# Patient Record
Sex: Male | Born: 1980 | Race: White | Hispanic: No | Marital: Married | Smoking: Former smoker
Health system: Southern US, Community
[De-identification: ages and names within clinical notes are randomized; demographics above are authoritative.]

## PROBLEM LIST (undated history)

## (undated) DIAGNOSIS — I10 Essential (primary) hypertension: Secondary | ICD-10-CM

## (undated) DIAGNOSIS — K760 Fatty (change of) liver, not elsewhere classified: Secondary | ICD-10-CM

## (undated) DIAGNOSIS — R7989 Other specified abnormal findings of blood chemistry: Secondary | ICD-10-CM

## (undated) DIAGNOSIS — K529 Noninfective gastroenteritis and colitis, unspecified: Secondary | ICD-10-CM

## (undated) DIAGNOSIS — F419 Anxiety disorder, unspecified: Secondary | ICD-10-CM

## (undated) HISTORY — PX: COLONOSCOPY: SHX5424

## (undated) HISTORY — PX: LACERATION REPAIR: SHX5284

## (undated) HISTORY — PX: TONSILLECTOMY: SUR1361

## (undated) HISTORY — DX: Noninfective gastroenteritis and colitis, unspecified: K52.9

## (undated) HISTORY — DX: Anxiety disorder, unspecified: F41.9

---

## 2021-04-21 ENCOUNTER — Encounter: Payer: Self-pay | Admitting: Gastroenterology

## 2021-04-21 ENCOUNTER — Ambulatory Visit (INDEPENDENT_AMBULATORY_CARE_PROVIDER_SITE_OTHER): Payer: BC Managed Care – PPO | Admitting: Gastroenterology

## 2021-04-21 ENCOUNTER — Other Ambulatory Visit: Payer: Self-pay

## 2021-04-21 DIAGNOSIS — R748 Abnormal levels of other serum enzymes: Secondary | ICD-10-CM

## 2021-04-21 DIAGNOSIS — K529 Noninfective gastroenteritis and colitis, unspecified: Secondary | ICD-10-CM

## 2021-04-21 NOTE — Patient Instructions (Addendum)
Continue mesalamine as you are doing. At some point, we may need to bump this up. However, you are clinically doing well, so we will leave it be for now.  I am requesting labs from your PCP and the colonoscopy reports from both procedures and pathology.   We may need to do an ultrasound of the liver, but I will review labs first.  I recommend cutting back/avoiding alcohol.   Avoid Ibuprofen, Advil, Aleve, Motrin, etc., as this can flare up inflammatory bowel disease symptoms.  We will be in touch shortly!  It was a pleasure to see you today. I want to create trusting relationships with patients to provide genuine, compassionate, and quality care. I value your feedback. If you receive a survey regarding your visit,  I greatly appreciate you taking time to fill this out.   Gelene Mink, PhD, ANP-BC [facility]astroenter[facility]

## 2021-04-21 NOTE — Progress Notes (Addendum)
Primary Care Physician:  No primary care provider on file. Referring Physician: Dr. Laney Pastor Primary Gastroenterologist:  Dr. Abbey Chatters  Chief Complaint  Patient presents with   Ulcerative Colitis    [facility] told he had crohns after 1st TCS and placed on mesalamine. Then saw another doctor in [facility] after 2nd TCS and was told they never said he had crohns but had UC. Currently not having symptoms right now    HPI:   Kevin Wu is a 40 y.o. male presenting today at the request of Dr. Laney Pastor for evaluation of IBD. Patient previously seen by [facility] and undergoing colonoscopies X 2. He received conflicting reports regarding Crohn's disease and UC.   About 2-2.5 years ago was having blood in stool. Assumed was hemorrhoids. Went to [place]. Had colonoscopy and was told had Crohn's. Placed on mesalamine. Had repeat colonoscopy Feb 2022 for follow-up. Was told he had UC at that time. Additional symptoms included urgent soft stools but not diarrhea. No significant abdominal pain. Pays attention to foods that trigger symptoms like spicy foods. No weight loss associated. No N/V. Initial colonoscopy Dec 2020?  Symptoms all completely resolved. Has been on mesalamine immediately after first colonoscopy. No significant NSAIDs. Believes his father has UC.   Elevated transaminases also noted Dec 2021: AST 52, ALT 48, Alk Phos 80, Tbili 0.4. He drinks several beers daily. More on weekends with liquor shots.   Path report only dated Feb 2022: chronic active colitis at IC valve. Inflamed fragments of colonic mucosa with focal superficial hyperplastic change of splenic flexure. I do not have actual report or initial colonoscopy and path.     Past Medical History:  Diagnosis Date   Anxiety    IBD (inflammatory bowel disease)     History reviewed. No pertinent surgical history.  Current Outpatient Medications  Medication Sig Dispense Refill   escitalopram (LEXAPRO) 10 MG  tablet Take 10 mg by mouth daily.     mesalamine (LIALDA) 1.2 g EC tablet Take 1.2 g by mouth every morning.     No current facility-administered medications for this visit.    Allergies as of 04/21/2021   (No Known Allergies)    Family History  Problem Relation Age of Onset   Ulcerative colitis Father    Colon polyps Neg Hx    Colon cancer Neg Hx     Social History   Socioeconomic History   Marital status: Married    Spouse name: Not on file   Number of children: Not on file   Years of education: Not on file   Highest education level: Not on file  Occupational History   Occupation: Owns [employer]  Tobacco Use   Smoking status: Former    Types: Cigarettes   Smokeless tobacco: Never  Substance and Sexual Activity   Alcohol use: Yes    Comment: few beers a night   Drug use: Never   Sexual activity: Not on file  Other Topics Concern   Not on file  Social History Narrative   Not on file   Social Determinants of Health   Financial Resource Strain: Not on file  Food Insecurity: Not on file  Transportation Needs: Not on file  Physical Activity: Not on file  Stress: Not on file  Social Connections: Not on file  Intimate Partner Violence: Not on file    Review of Systems: Gen: Denies any fever, chills, fatigue, weight loss, lack of appetite.  CV: Denies chest  pain, heart palpitations, peripheral edema, syncope.  Resp: Denies shortness of breath at rest or with exertion. Denies wheezing or cough.  GI: see HPI GU : Denies urinary burning, urinary frequency, urinary hesitancy MS: Denies joint pain, muscle weakness, cramps, or limitation of movement.  Derm: Denies rash, itching, dry skin Psych: Denies depression, anxiety, memory loss, and confusion Heme: Denies bruising, bleeding, and enlarged lymph nodes.  Physical Exam: BP 130/87   Pulse 65   Temp 98 F (36.7 C)   Ht '6\' 2"'  (1.88 m)   Wt 176 lb 9.6 oz (80.1 kg)   BMI 22.67 kg/m  General:   Alert and  oriented. Pleasant and cooperative. Well-nourished and well-developed.  Head:  Normocephalic and atraumatic. Eyes:  Without icterus, sclera clear and conjunctiva pink.  Ears:  Normal auditory acuity. Mouth:  mask in place Lungs:  Clear to auscultation bilaterally. No wheezes, rales, or rhonchi. No distress.  Heart:  S1, S2 present without murmurs appreciated.  Abdomen:  +BS, soft, non-tender and non-distended. No HSM noted. No guarding or rebound. No masses appreciated.  Rectal:  Deferred  Msk:  Symmetrical without gross deformities. Normal posture. Extremities:  Without edema. Neurologic:  Alert and  oriented x4;  grossly normal neurologically. Skin:  Intact without significant lesions or rashes. Psych:  Alert and cooperative. Normal mood and affect.  ASSESSMENT: Kevin Wu is a 40 y.o. male presenting today with diagnosis of IBD in late 2020 by [facility] (Dr. Earley Brooke) and here for a second opinion. Unfortunately, I do not have colonoscopy reports from Dec 2020 or Feb 2022; I only have path from Feb 2022. He is on below maintenance dosage of Lialda at 1.2 grams daily, but he has had complete symptom resolution.  Also noted to have mildly elevated transaminases, which I suspect may be due to ETOH intake. No imaging on file. Will retrieve labs from PCP that were done recently.  As of note, father with history of UC. Discussed with patient we will need to obtain all reports before labeling type of IBD. As of now, he is clinically doing well. I am not changing mesalamine dosage just yet until I can review prior notes.    PLAN: Avoid NSAIDs Avoid/limit ETOH Obtain labs from PCP Obtain colonoscopy reports and path from [facility] Further recommendations to follow   Annitta Needs, PhD, ANP-BC [facility]enter[facility]   Addendum on 10/5: received colonoscopy report from Feb 2022 but not from first colonoscopy. Feb 2022 colonoscopy with non-specific superficial ulcers on the IC  valve, TI was normal. Deformity suggestive of previous polypectomy noted around splenic flexure.   Addendum on 10/19: Received colonoscopy report from Jan 2021: patchy discontinuous ulceration, friability, and erythema in cecum, hepatic flexure, transverse, and sigmoid. Pathology with chronic active colitis.  Dealing with Crohn's disease. Mesalamine would not be ideal treatment, but as he is already on this, will continue for now. May need to revisit changing this in the future. He is on low dosing at 1.2 grams daily. Will bump up to 2.4 grams daily.

## 2021-04-27 ENCOUNTER — Other Ambulatory Visit: Payer: Self-pay

## 2021-04-27 ENCOUNTER — Telehealth: Payer: Self-pay | Admitting: Gastroenterology

## 2021-04-27 ENCOUNTER — Encounter: Payer: Self-pay | Admitting: Gastroenterology

## 2021-04-27 DIAGNOSIS — K529 Noninfective gastroenteritis and colitis, unspecified: Secondary | ICD-10-CM

## 2021-04-27 DIAGNOSIS — R748 Abnormal levels of other serum enzymes: Secondary | ICD-10-CM

## 2021-04-27 NOTE — Progress Notes (Signed)
Phoned the pt and LMOVM for the pt to return call 

## 2021-04-27 NOTE — Progress Notes (Signed)
Pt returned call and was advised of the lab results and recommendations of a U/S of his liver, recommendations regarding alcohol and repeat bloodwork in 3 months. Waiting on colonoscopy report. Pt is  agreeable to these things.

## 2021-04-27 NOTE — Telephone Encounter (Signed)
[facility] sent [place] only Feb 2022 colonoscopy and path reports. We had asked for the ones from prior to that as well (2020/2021?). We need colonoscopy reports from then. Thanks!

## 2021-04-27 NOTE — Progress Notes (Signed)
Received outside labs dated July 2022:  Hgb 15.2, Hct 43.9, platelets 277.   BUN 16, Creatinine 0.89, Tbili 0.8, AST 72, ALT 44.   Please let patient know I would recommend an ultrasound of his liver. His enzymes are likely slightly elevated due to alcohol, as we talked about. I am holding off on extensive labs for now but recommend avoiding/limiting alcohol and rechecking HFP in 3 months.  I am still waiting on outside reports from first colonoscopy. Thanks!

## 2021-05-11 ENCOUNTER — Encounter: Payer: Self-pay | Admitting: Gastroenterology

## 2021-05-11 NOTE — Telephone Encounter (Signed)
Phoned the pt and LMOVM for the pt to return call 

## 2021-05-11 NOTE — Telephone Encounter (Signed)
Kevin Wu, please let patient know that we are likely dealing with Crohn's disease. I reviewed with Dr. Marletta Lor.   He is on Lialda 1.2 grams. Mesalamine is not typically given for Crohn's disease. However, as he is stable on this, we will continue for now. He is not on ideal dosing of mesalamine. Recommend increasing to 2 tablets daily. This would be a maintenance dose. We have room to bump it up to 4 tablets daily but let's start with 2.  Let me know if any questions!  We need to see him in about 6-8 months.

## 2021-05-12 ENCOUNTER — Encounter: Payer: Self-pay | Admitting: Internal Medicine

## 2021-05-12 ENCOUNTER — Other Ambulatory Visit: Payer: Self-pay

## 2021-05-12 DIAGNOSIS — R748 Abnormal levels of other serum enzymes: Secondary | ICD-10-CM

## 2021-05-12 NOTE — Progress Notes (Signed)
[place] abd complete scheduled for 05/17/21 at 10:30am, arrive at 10:15am. NPO after midnight prior to test.  Called and informed pt of [place] appt. Letter mailed.

## 2021-05-12 NOTE — Progress Notes (Signed)
Please arrange US abdomen complete due to elevated LFTs. Thanks!

## 2021-05-13 ENCOUNTER — Telehealth: Payer: Self-pay | Admitting: Gastroenterology

## 2021-05-13 NOTE — Telephone Encounter (Signed)
Refill request received for mesalamine 1.2 gm tab, to be sent to [facility] [place]. Last office visit was 04/21/2021 with Tobi Bastos.

## 2021-05-16 MED ORDER — MESALAMINE 1.2 G PO TBEC: 2.4000 g | DELAYED_RELEASE_TABLET | Freq: Every morning | ORAL | 5 refills | Status: DC

## 2021-05-16 NOTE — Addendum Note (Signed)
Addended by: Tiffany Kocher on: 05/16/2021 08:39 AM   Modules accepted: Orders

## 2021-05-16 NOTE — Telephone Encounter (Signed)
Phoned and LMOVM for the pt to return call 

## 2021-05-17 ENCOUNTER — Ambulatory Visit (HOSPITAL_COMMUNITY)
Admission: RE | Admit: 2021-05-17 | Discharge: 2021-05-17 | Disposition: A | Discharge status: Home / Self Care | Payer: BC Managed Care – PPO | Source: Ambulatory Visit | Attending: Gastroenterology | Admitting: Gastroenterology

## 2021-05-17 ENCOUNTER — Other Ambulatory Visit: Payer: Self-pay

## 2021-05-17 DIAGNOSIS — R748 Abnormal levels of other serum enzymes: Secondary | ICD-10-CM | POA: Diagnosis not present

## 2021-05-18 NOTE — Telephone Encounter (Signed)
Letter mailed out to the pt today. 

## 2021-05-20 NOTE — Telephone Encounter (Signed)
Phoned and spoke with the pt this morning, advised of the note and regarding his medication. He is not on Lialda 1.2 grams. He is taking 2 of the Mesalamine that was the instructions on the bottle when he picked up from the pharmacy.

## 2021-05-20 NOTE — Telephone Encounter (Signed)
Noted  

## 2021-05-20 NOTE — Telephone Encounter (Signed)
Thanks! Yes, we increased his mesalamine to 2 per day. That happened in the meantime.

## 2021-11-16 ENCOUNTER — Ambulatory Visit: Payer: BC Managed Care – PPO | Admitting: Gastroenterology

## 2022-02-14 ENCOUNTER — Ambulatory Visit: Payer: BC Managed Care – PPO | Admitting: Gastroenterology

## 2022-02-27 ENCOUNTER — Other Ambulatory Visit: Payer: Self-pay | Admitting: Gastroenterology

## 2022-02-27 NOTE — Telephone Encounter (Signed)
Sending in limited refill. He needs to keep his appointment with Tobi Bastos on 04/05/22.

## 2022-04-05 ENCOUNTER — Ambulatory Visit (INDEPENDENT_AMBULATORY_CARE_PROVIDER_SITE_OTHER): Payer: BC Managed Care – PPO | Admitting: Gastroenterology

## 2022-04-05 ENCOUNTER — Encounter: Payer: Self-pay | Admitting: Gastroenterology

## 2022-04-05 VITALS — BP 125/88 | HR 87 | Temp 97.9°F | Ht 74.0 in | Wt 164.6 lb

## 2022-04-05 DIAGNOSIS — R748 Abnormal levels of other serum enzymes: Secondary | ICD-10-CM

## 2022-04-05 DIAGNOSIS — K529 Noninfective gastroenteritis and colitis, unspecified: Secondary | ICD-10-CM | POA: Diagnosis not present

## 2022-04-05 MED ORDER — MESALAMINE 1.2 G PO TBEC: 2.4000 g | DELAYED_RELEASE_TABLET | Freq: Every morning | ORAL | 3 refills | Status: DC

## 2022-04-05 NOTE — Progress Notes (Signed)
Gastroenterology Office Note     Primary Care Physician:  Maximiano Coss, MD  Primary Gastroenterologist: Dr. Marletta Lor    Chief Complaint   Chief Complaint  Patient presents with   Inflammatory Bowel Disease    Follow up on IBD. Taking mesalamine. Doing well on med. No concerns.      History of Present Illness   Kevin Wu is a 41 y.o. male presenting today in follow-up with a history of Crohn's disease initially diagnosed in 2020 by Dr. Samuella Cota, now established with [place]. He was placed on mesalamine by prior GI. History of mildly elevated transaminases as well. [place] with fatty liver. Last colonoscopy Feb 2022 with non-specific superficial ulcers on IC valve, TI normal.    He is taking 2.4 grams of mesalamine daily. No abdominal pain, N/V, changes in bowel habits, constipation, diarrhea, overt GI bleeding, GERD, dysphagia, unexplained weight loss, lack of appetite, unexplained weight gain.    Received outside labs dated July 2022:   Hgb 15.2, Hct 43.9, platelets 277.    BUN 16, Creatinine 0.89, Tbili 0.8, AST 72, ALT 44.   Drinks several beers and several shots a day.    Past Medical History:  Diagnosis Date   Anxiety    IBD (inflammatory bowel disease)     No past surgical history on file.  Current Outpatient Medications  Medication Sig Dispense Refill   escitalopram (LEXAPRO) 10 MG tablet Take 10 mg by mouth daily.     mesalamine (LIALDA) 1.2 g EC tablet Take 2 tablets (2.4 g total) by mouth every morning. 180 tablet 3   No current facility-administered medications for this visit.    Allergies as of 04/05/2022   (No Known Allergies)    Family History  Problem Relation Age of Onset   Ulcerative colitis Father    Colon polyps Neg Hx    Colon cancer Neg Hx     Social History   Socioeconomic History   Marital status: Married    Spouse name: Not on file   Number of children: Not on file   Years of education: Not on file   Highest  education level: Not on file  Occupational History   Occupation: Owns Teacher, early years/pre  Tobacco Use   Smoking status: Former    Types: Cigarettes    Passive exposure: Current   Smokeless tobacco: Never  Substance and Sexual Activity   Alcohol use: Yes    Comment: few beers a night   Drug use: Never   Sexual activity: Not on file  Other Topics Concern   Not on file  Social History Narrative   Not on file   Social Determinants of Health   Financial Resource Strain: Not on file  Food Insecurity: Not on file  Transportation Needs: Not on file  Physical Activity: Not on file  Stress: Not on file  Social Connections: Not on file  Intimate Partner Violence: Not on file     Review of Systems   Gen: Denies any fever, chills, fatigue, weight loss, lack of appetite.  CV: Denies chest pain, heart palpitations, peripheral edema, syncope.  Resp: Denies shortness of breath at rest or with exertion. Denies wheezing or cough.  GI: Denies dysphagia or odynophagia. Denies jaundice, hematemesis, fecal incontinence. GU : Denies urinary burning, urinary frequency, urinary hesitancy MS: Denies joint pain, muscle weakness, cramps, or limitation of movement.  Derm: Denies rash, itching, dry skin Psych: Denies depression, anxiety, memory loss, and confusion Heme: Denies  bruising, bleeding, and enlarged lymph nodes.   Physical Exam   BP 125/88 (BP Location: Left Arm, Patient Position: Sitting, Cuff Size: Large)   Pulse 87   Temp 97.9 F (36.6 C) (Oral)   Ht 6\' 2"  (1.88 m)   Wt 164 lb 9.6 oz (74.7 kg)   BMI 21.13 kg/m  General:   Alert and oriented. Pleasant and cooperative. Well-nourished and well-developed.  Head:  Normocephalic and atraumatic. Eyes:  Without icterus Abdomen:  +BS, soft, non-tender and non-distended. No HSM noted. No guarding or rebound. No masses appreciated.  Rectal:  Deferred  Msk:  Symmetrical without gross deformities. Normal posture. Extremities:  Without  edema. Neurologic:  Alert and  oriented x4;  grossly normal neurologically. Skin:  Intact without significant lesions or rashes. Psych:  Alert and cooperative. Normal mood and affect.   Assessment   Kevin Wu is a 41 y.o. male presenting today in follow-up with a history of Crohn's disease initially diagnosed in 2020 by Dr. 2021, now established with Samuella Cota. He was placed on mesalamine by prior GI. History of mildly elevated transaminases as well. [place] with fatty liver. Last colonoscopy Feb 2022 with non-specific superficial ulcers on IC valve, TI normal.    He is actually clinically doing well on 2.4 grams of mesalamine daily. No concerns today.   Elevated transaminases in setting of fatty liver and alcohol intake. Discussed serial monitoring of labs, alcohol cessation. Will hold off on extensive serologies unless LFTs remain elevated.      PLAN    Continue mesalamine CBC, CMP Return in 1 year   Mar 2022, PhD, [facility] Gastroenterology

## 2022-04-05 NOTE — Patient Instructions (Signed)
Please have blood work done.  We will see you in 1 year or sooner if needed.  Please call with any changes or concerns!  As we discussed, it would be best to avoid alcohol due to known fatty liver.   I enjoyed seeing you again today! As you know, I value our relationship and want to provide genuine, compassionate, and quality care. I welcome your feedback. If you receive a survey regarding your visit,  I greatly appreciate you taking time to fill this out. See you next time!  Gelene Mink, PhD, ANP-BC [facility] Gastroenterology

## 2022-05-04 LAB — CBC WITH DIFFERENTIAL/PLATELET
Absolute Monocytes: 416 cells/uL (ref 200–950)
Basophils Absolute: 30 cells/uL (ref 0–200)
Basophils Relative: 0.9 %
Eosinophils Absolute: 142 cells/uL (ref 15–500)
Eosinophils Relative: 4.3 %
HCT: 43.4 % (ref 38.5–50.0)
Hemoglobin: 15.2 g/dL (ref 13.2–17.1)
Lymphs Abs: 944 cells/uL (ref 850–3900)
MCH: 33.3 pg — ABNORMAL HIGH (ref 27.0–33.0)
MCHC: 35 g/dL (ref 32.0–36.0)
MCV: 95.2 fL (ref 80.0–100.0)
MPV: 9.3 fL (ref 7.5–12.5)
Monocytes Relative: 12.6 %
Neutro Abs: 1769 cells/uL (ref 1500–7800)
Neutrophils Relative %: 53.6 %
Platelets: 271 10*3/uL (ref 140–400)
RBC: 4.56 10*6/uL (ref 4.20–5.80)
RDW: 11.7 % (ref 11.0–15.0)
Total Lymphocyte: 28.6 %
WBC: 3.3 10*3/uL — ABNORMAL LOW (ref 3.8–10.8)

## 2022-05-04 LAB — COMPLETE METABOLIC PANEL WITH GFR
AG Ratio: 1.6 (calc) (ref 1.0–2.5)
ALT: 18 U/L (ref 9–46)
AST: 27 U/L (ref 10–40)
Albumin: 4.7 g/dL (ref 3.6–5.1)
Alkaline phosphatase (APISO): 42 U/L (ref 36–130)
BUN: 11 mg/dL (ref 7–25)
CO2: 25 mmol/L (ref 20–32)
Calcium: 9.5 mg/dL (ref 8.6–10.3)
Chloride: 106 mmol/L (ref 98–110)
Creat: 0.78 mg/dL (ref 0.60–1.29)
Globulin: 3 g/dL (calc) (ref 1.9–3.7)
Glucose, Bld: 85 mg/dL (ref 65–99)
Potassium: 4.3 mmol/L (ref 3.5–5.3)
Sodium: 142 mmol/L (ref 135–146)
Total Bilirubin: 0.4 mg/dL (ref 0.2–1.2)
Total Protein: 7.7 g/dL (ref 6.1–8.1)
eGFR: 116 mL/min/{1.73_m2} (ref >=60)

## 2022-05-11 ENCOUNTER — Other Ambulatory Visit: Payer: Self-pay

## 2022-05-11 DIAGNOSIS — R748 Abnormal levels of other serum enzymes: Secondary | ICD-10-CM

## 2022-05-11 DIAGNOSIS — K529 Noninfective gastroenteritis and colitis, unspecified: Secondary | ICD-10-CM

## 2022-05-15 ENCOUNTER — Other Ambulatory Visit: Payer: Self-pay

## 2022-05-15 DIAGNOSIS — K529 Noninfective gastroenteritis and colitis, unspecified: Secondary | ICD-10-CM

## 2022-05-15 DIAGNOSIS — R748 Abnormal levels of other serum enzymes: Secondary | ICD-10-CM

## 2022-05-15 NOTE — Progress Notes (Signed)
Error

## 2022-10-12 IMAGING — US US ABDOMEN COMPLETE
1 series · 14 of 25 positions shown · non-contrast
Comparison: None.

CLINICAL DATA: Elevated LFT

EXAM:
ABDOMEN ULTRASOUND COMPLETE

[Series 1: us abdomen complete · 0.19mm/px · 14 of 132 slices shown]
[im 1/132]
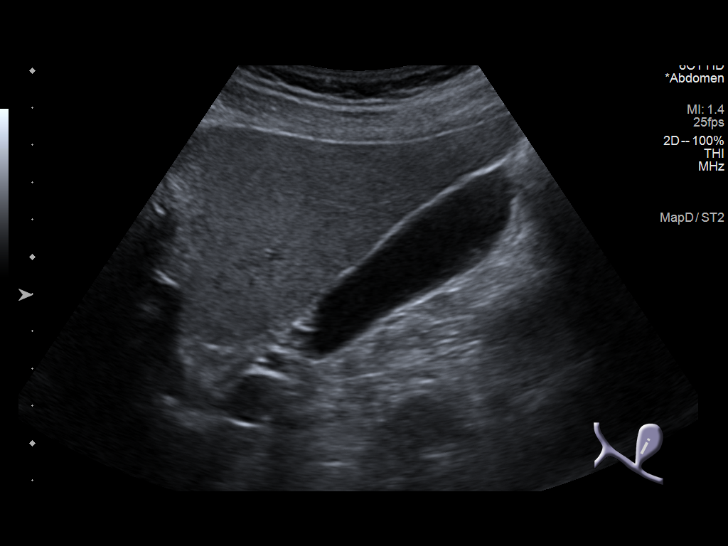
[im 11/132]
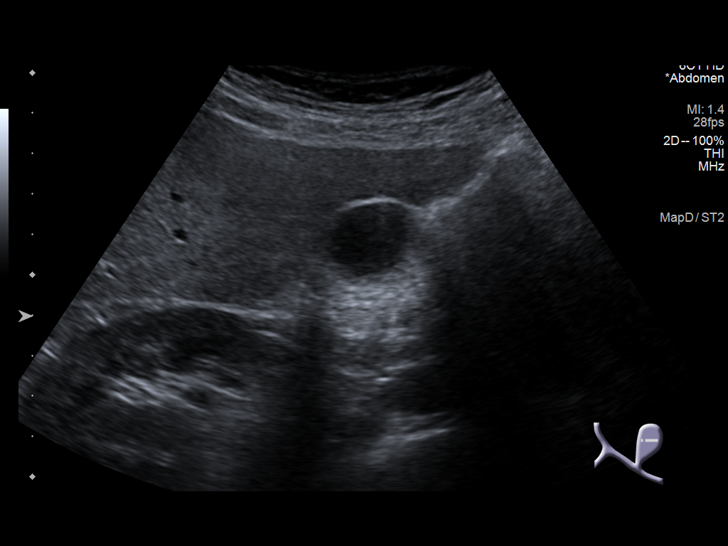
[im 22/132]
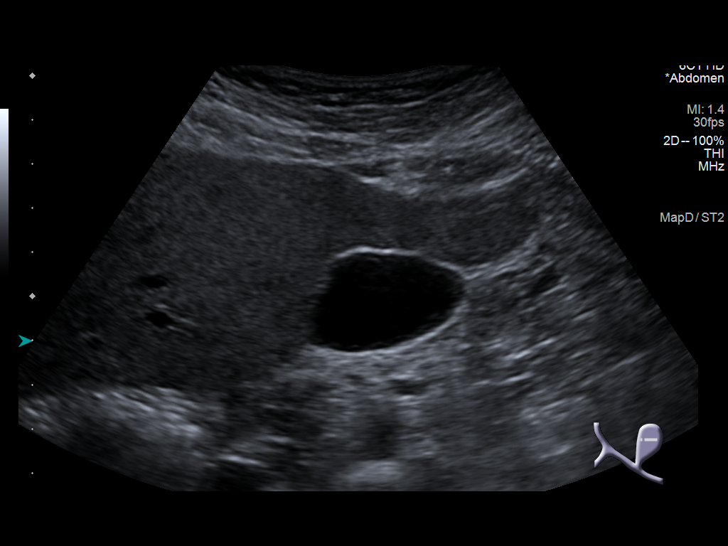
[im 33/132]
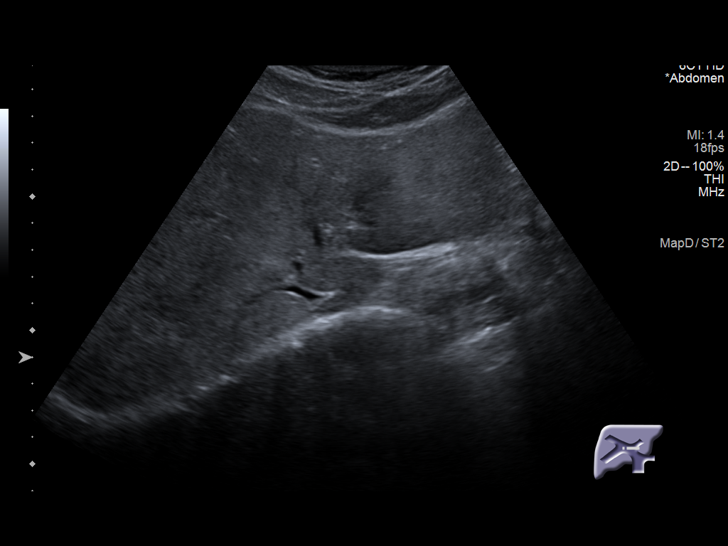
[im 44/132]
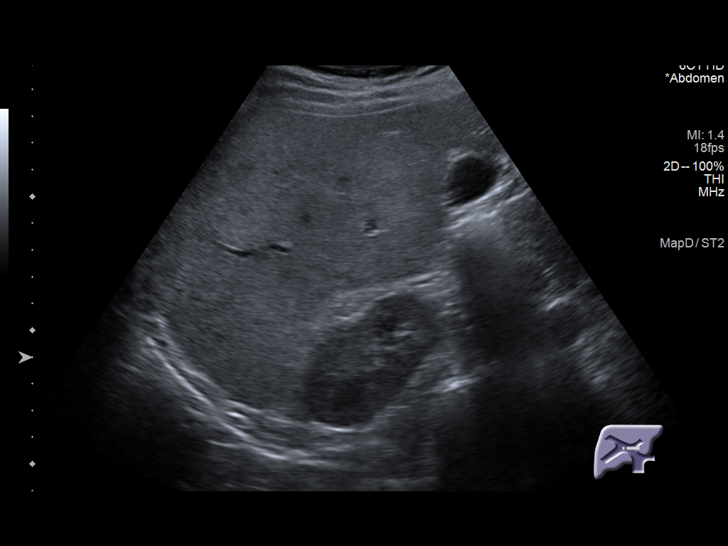
[im 50/132]
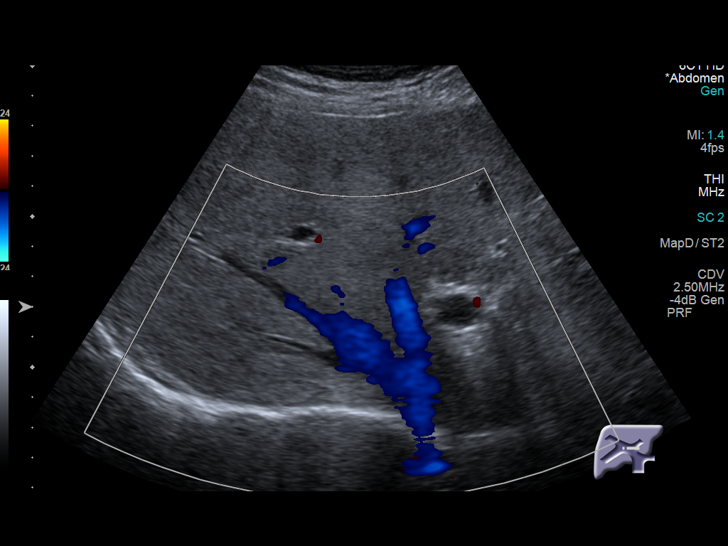
[im 61/132]
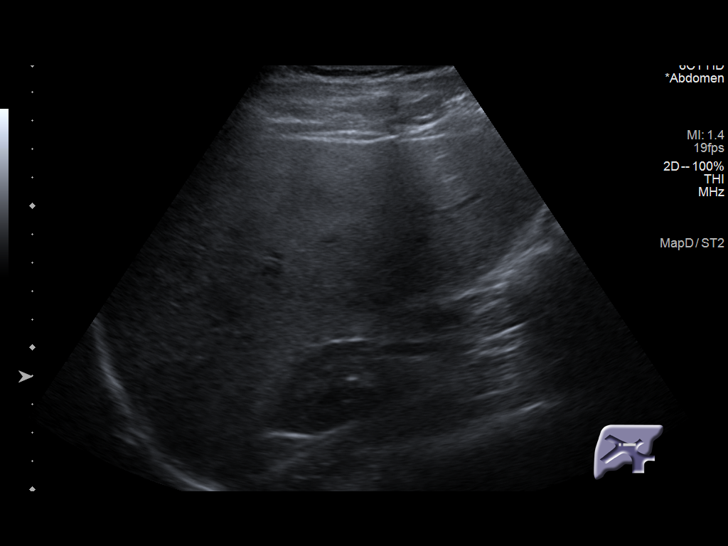
[im 71/132]
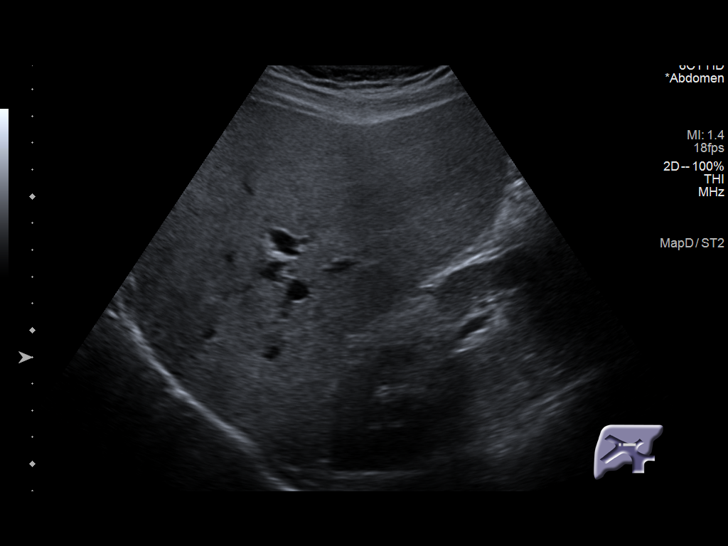
[im 82/132]
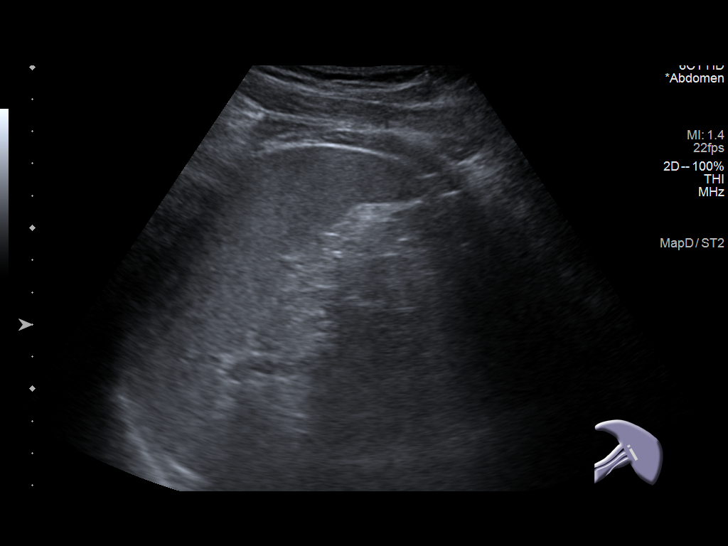
[im 88/132]
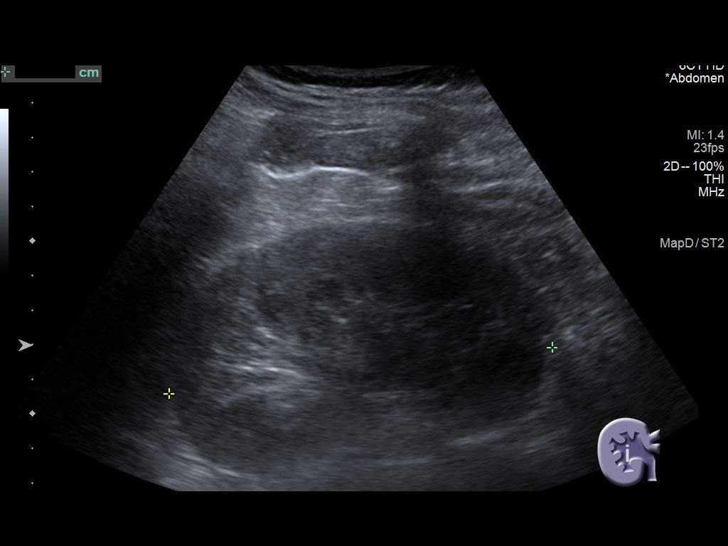
[im 99/132]
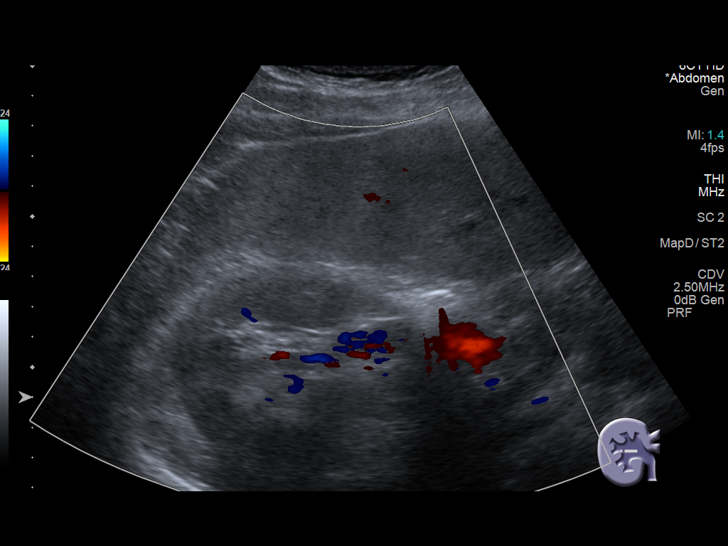
[im 110/132]
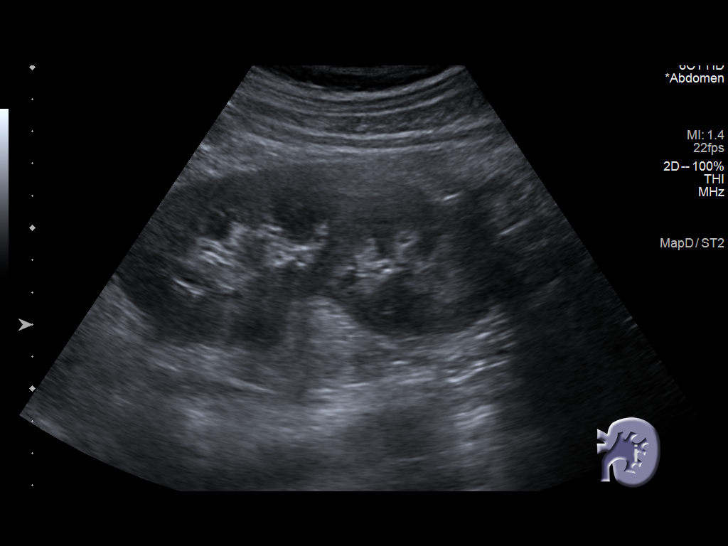
[im 121/132]
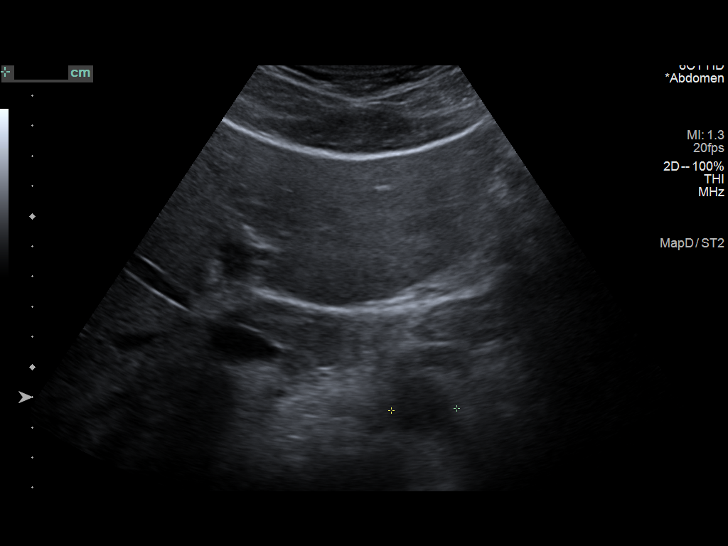
[im 132/132]
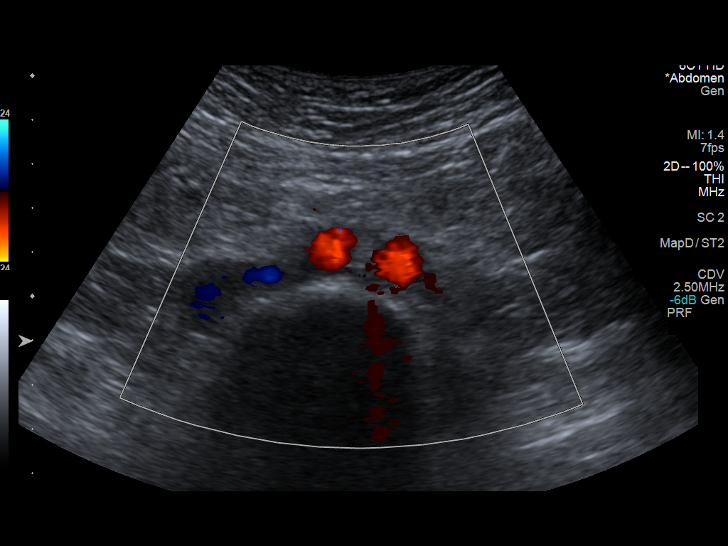

[14 of 25 positions shown; findings below may reference images not displayed]

FINDINGS: Gallbladder: No gallstones or wall thickening visualized. No
sonographic Murphy sign noted by sonographer.

Common bile duct: Diameter: 4.8 mm

Liver: Slightly echogenic. No focal hepatic abnormality. Portal vein
is patent on color Doppler imaging with normal direction of blood
flow towards the liver.

IVC: No abnormality visualized.

Pancreas: Visualized portion unremarkable.

Spleen: Size and appearance within normal limits.

Right Kidney: Length: 11.2 cm. Echogenicity within normal limits. No
mass or hydronephrosis visualized.

Left Kidney: Length: 11.1 cm. Echogenicity within normal limits. No
mass or hydronephrosis visualized.

Abdominal aorta: No aneurysm visualized.

Other findings: None.
IMPRESSION: Echogenic liver consistent with hepatic steatosis. Otherwise
negative abdominal ultrasound

## 2023-03-08 ENCOUNTER — Encounter: Payer: Self-pay | Admitting: Gastroenterology

## 2023-05-08 ENCOUNTER — Other Ambulatory Visit: Payer: Self-pay | Admitting: Gastroenterology

## 2023-08-28 NOTE — Progress Notes (Signed)
Called patient and left a message asking him to call and schedule a follow up appt

## 2023-08-30 ENCOUNTER — Other Ambulatory Visit: Payer: Self-pay | Admitting: Gastroenterology

## 2023-08-31 LAB — HEPATIC FUNCTION PANEL
AG Ratio: 1.9 (calc) (ref 1.0–2.5)
ALT: 35 U/L (ref 9–46)
AST: 39 U/L (ref 10–40)
Albumin: 4.7 g/dL (ref 3.6–5.1)
Alkaline phosphatase (APISO): 43 U/L (ref 36–130)
Bilirubin, Direct: 0.1 mg/dL (ref 0.0–0.2)
Globulin: 2.5 g/dL (ref 1.9–3.7)
Indirect Bilirubin: 0.6 mg/dL (ref 0.2–1.2)
Total Bilirubin: 0.7 mg/dL (ref 0.2–1.2)
Total Protein: 7.2 g/dL (ref 6.1–8.1)

## 2023-09-27 ENCOUNTER — Encounter: Payer: Self-pay | Admitting: Gastroenterology

## 2023-09-27 ENCOUNTER — Other Ambulatory Visit: Payer: Self-pay | Admitting: *Deleted

## 2023-09-27 ENCOUNTER — Ambulatory Visit (INDEPENDENT_AMBULATORY_CARE_PROVIDER_SITE_OTHER): Payer: BC Managed Care – PPO | Admitting: Gastroenterology

## 2023-09-27 VITALS — BP 130/92 | HR 76 | Temp 98.1°F | Ht 74.0 in | Wt 191.0 lb

## 2023-09-27 DIAGNOSIS — K4090 Unilateral inguinal hernia, without obstruction or gangrene, not specified as recurrent: Secondary | ICD-10-CM

## 2023-09-27 DIAGNOSIS — K529 Noninfective gastroenteritis and colitis, unspecified: Secondary | ICD-10-CM

## 2023-09-27 NOTE — Progress Notes (Signed)
 Gastroenterology Office Note     Primary Care Physician:  Kevin Coss, MD  Primary Gastroenterologist: Dr. Marletta Wu    Chief Complaint   Chief Complaint  Patient presents with   Inflammatory Bowel Disease    Follow up on IBD. On mesalamine. States doing well.no concerns      History of Present Illness   Kevin Wu is a 43 y.o. 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 Last colonoscopy Feb 2022 as noted below. Additional pertinent GI history including hepatic steatosis, elevated transaminases in setting of ETOH use.   Returning today for refills and follow-up of IBD. No overt GI bleeding. No abdominal pain. He does have urgency at times but reports getting used to this over time. No significant concerns today. Needs updated labs, inflammatory markers, fecal calprotectin. Recommending colonoscopy in near future with TI biopsies.       Colonoscopy Jan 2021: patchy discontinuous ulceration, friability and erythema in cecum, hepatic flexure, transverse, and sigmoid colon compatible with Crohn's disease. Path with chronic active colitis of cecum, transverse colon, and sigmoid   Colonoscopy Feb 2022: non-specific superficial ulcers on IC valve, TI normal, path with chronic active colitis with evidence of ulceration, inflamed fragments of splenic flexure.   Past Medical History:  Diagnosis Date   Anxiety    IBD (inflammatory bowel disease)     No past surgical history on file.  Current Outpatient Medications  Medication Sig Dispense Refill   buPROPion (WELLBUTRIN SR) 150 MG 12 hr tablet Take 150 mg by mouth 2 (two) times daily.     escitalopram (LEXAPRO) 10 MG tablet Take 10 mg by mouth daily.     mesalamine (LIALDA) 1.2 g EC tablet TAKE 2 TABLETS BY  MOUTH EVERY MORNING 180 tablet 0   Multiple Vitamin (MULTIVITAMIN) tablet Take 1 tablet by mouth daily.     No current facility-administered medications for this visit.    Allergies as of 09/27/2023   (No Known Allergies)    Family History  Problem Relation Age of Onset   Ulcerative colitis Father        patient not sure   Colon polyps Neg Hx    Colon cancer Neg Hx     Social History   Socioeconomic History   Marital status: Married    Spouse name: Not on file   Number of children: Not on file   Years of education: Not on file   Highest education level: Not on file  Occupational History   Occupation: Owns Teacher, early years/pre  Tobacco Use   Smoking status: Former    Types: Cigarettes    Passive exposure: Current   Smokeless tobacco: Never  Substance and Sexual Activity   Alcohol use: Yes    Comment: few beers a night   Drug use: Never   Sexual activity: Not on file  Other Topics Concern   Not on file  Social History Narrative   Not on file   Social Drivers of Health   Financial Resource Strain: Not on file  Food Insecurity: Not on file  Transportation Needs: Not on file  Physical Activity: Not on file  Stress: Not on file  Social Connections: Not on file  Intimate Partner Violence: Not on file     Review of Systems  Gen: Denies any fever, chills, fatigue, weight loss, lack of appetite.  CV: Denies chest pain, heart palpitations, peripheral edema, syncope.  Resp: Denies shortness of breath at rest or with exertion. Denies wheezing or cough.  GI: Denies dysphagia or odynophagia. Denies jaundice, hematemesis, fecal incontinence. GU : Denies urinary burning, urinary frequency, urinary hesitancy MS: Denies joint pain, muscle weakness, cramps, or limitation of movement.  Derm: Denies rash, itching, dry skin Psych: Denies depression, anxiety, memory loss, and confusion Heme: Denies bruising, bleeding, and enlarged lymph nodes.   Physical Exam   BP (!) 130/92    Pulse 76   Temp 98.1 F (36.7 C)   Ht 6\' 2"  (1.88 m)   Wt 191 lb (86.6 kg)   BMI 24.52 kg/m  General:   Alert and oriented. Pleasant and cooperative. Well-nourished and well-developed.  Head:  Normocephalic and atraumatic. Eyes:  Without icterus Abdomen:  +BS, soft, non-tender and non-distended. No HSM noted. No guarding or rebound. No masses appreciated.  Rectal:  Deferred  Msk:  Symmetrical without gross deformities. Normal posture. Extremities:  Without edema. Neurologic:  Alert and  oriented x4;  grossly normal neurologically. Skin:  Intact without significant lesions or rashes. Psych:  Alert and cooperative. Normal mood and affect.   Assessment   Kevin Wu is a 43 y.o. male presenting today with a history ofsuspected ileocolonic Crohn's disease diagnosed in 2021 at outside facility and placed on mesalamine after first colonoscopy Jan 2021; he notes there had been some question between UC and Crohn's at time of colonoscopy. Last colonoscopy Feb 2022 as noted above.  Crohn's: he is not on ideal therapy for Crohn's disease, as he is on a mesalamine. Upon further discussion, he does note urgency, and I also note last colonoscopy Feb 2022 with chronic active colitis. I query if he is in true remission at this time. Will order inflammatory markers, fecal cal, and recommend ileocolonoscopy. He may ultimately need different therapy and suspect his QOL could be improved regarding fecal urgency; interestingly, he has gotten used to this as a baseline, and I suspect could be improved.     PLAN    Labs including inflammatory markers, fecal cal Proceed with colonoscopy by Dr. Marletta Wu  in near future: the risks, benefits, and alternatives have been discussed with the patient in detail. The patient states understanding and desires to proceed.  Recommend significantly cutting back/avoiding on ETOH in setting of known hepatic steatosis   Kevin Mink, PhD, ANP-BC [facility]  Gastroenterology

## 2023-09-27 NOTE — Patient Instructions (Signed)
 I have ordered routine labs to have done at [facility]. This checks inflammatory markers so we can see how well things are controlled. I also ordered a separate stool test, which is more sensitive for inflammation and a good baseline we can have on file.  For now, continue mesalamine. We are arranging a colonoscopy in the near future! We can then see if you are in remission or if you need more escalated therapy. The good news is there are lots of options out there that are easy to take and will fit with your lifestyle and improve quality of life.  Return in 3 months after colonoscopy!  I enjoyed seeing you again today! I value our relationship and want to provide genuine, compassionate, and quality care. You may receive a survey regarding your visit with me, and I welcome your feedback! Thanks so much for taking the time to complete this. I look forward to seeing you again.      Gelene Mink, PhD, ANP-BC [facility]

## 2023-09-28 ENCOUNTER — Telehealth: Payer: Self-pay | Admitting: *Deleted

## 2023-09-28 ENCOUNTER — Encounter: Payer: Self-pay | Admitting: *Deleted

## 2023-09-28 ENCOUNTER — Other Ambulatory Visit: Payer: Self-pay | Admitting: *Deleted

## 2023-09-28 MED ORDER — PEG 3350-KCL-NA BICARB-NACL 420 G PO SOLR: 4000.0000 mL | Freq: Once | ORAL | 0 refills | Status: DC

## 2023-09-28 NOTE — Telephone Encounter (Signed)
 [employer] PA: Notification or Prior Authorization is not required for the requested services You are not required to submit a notification/prior authorization based on the information provided. The number above acknowledges your inquiry and our response. Please reference this number for future inquiries. Notification is not a guarantee of coverage or payment. Questions should be directed to [email or web address] > Eligibility or [phone]. Decision ID #: [id number]

## 2023-09-28 NOTE — Telephone Encounter (Signed)
 Pt called back to reschedule his procedure that was scheduled for 11/06/23. He said he would prefer to have mid May. Advised pt that we don't have providers schedule for May  but once we get it, we will give him a call be to rescheduled.

## 2023-10-26 ENCOUNTER — Ambulatory Visit: Payer: Self-pay | Admitting: General Surgery

## 2023-10-26 NOTE — Progress Notes (Signed)
 Surgery orders requested via Epic inbox.

## 2023-10-31 NOTE — Progress Notes (Signed)
 COVID Vaccine received:  []  No [x]  Yes Date of any COVID positive Test in last 90 days:  PCP - Donnetta Hutching, MD at [facility]  [place] [phone] Cardiologist - none  Chest x-ray -  EKG -  Will do at PST Stress Test -  ECHO -  Cardiac Cath -   Bowel Prep - [x]  No  []   Yes ______  Pacemaker / ICD device [x]  No []  Yes   Spinal Cord Stimulator:[x]  No []  Yes       History of Sleep Apnea? [x]  No []  Yes   CPAP used?- [x]  No []  Yes    Does the patient monitor blood sugar?   [x]  N/A   []  No []  Yes  Patient has: [x]  NO Hx DM   []  Pre-DM   []  DM1  []   DM2 Last A1c was:  5.2   on  02-08-2021     Blood Thinner / Instructions:  none Aspirin Instructions:  none  ERAS Protocol Ordered: []  No  [x]  Yes PRE-SURGERY []  ENSURE  []  G2   [x]  No Drink Ordered Patient is to be NPO after: 0730  Dental hx: []  Dentures:  []  N/A      []  Bridge or Partial:                   []  Loose or Damaged teeth:   Comments:   Activity level: Patient is able / unable to climb a flight of stairs without difficulty; []  No CP  []  No SOB, but would have ___   Patient can / can not perform ADLs without assistance.   Anesthesia review: HTN- no meds, IBS, Fatty liver, ?LFTS, smokes, ETOH abuse, anxiety  Patient denies shortness of breath, fever, cough and chest pain at PAT appointment.  Patient verbalized understanding and agreement to the Pre-Surgical Instructions that were given to them at this PAT appointment. Patient was also educated of the need to review these PAT instructions again prior to his surgery.I reviewed the appropriate phone numbers to call if they have any and questions or concerns.

## 2023-10-31 NOTE — Patient Instructions (Signed)
 SURGICAL WAITING ROOM VISITATION Patients having surgery or a procedure may have no more than 2 support people in the waiting area - these visitors may rotate in the visitor waiting room.   If the patient needs to stay at the hospital during part of their recovery, the visitor guidelines for inpatient rooms apply.  PRE-OP VISITATION  Pre-op nurse will coordinate an appropriate time for 1 support person to accompany the patient in pre-op.  This support person may not rotate.  This visitor will be contacted when the time is appropriate for the visitor to come back in the pre-op area.  Please refer to the [facility] website for the visitor guidelines for Inpatients (after your surgery is over and you are in a regular room).  You are not required to quarantine at this time prior to your surgery. However, you must do this: Hand Hygiene often Do NOT share personal items Notify your provider if you are in close contact with someone who has COVID or you develop fever 100.4 or greater, new onset of sneezing, cough, sore throat, shortness of breath or body aches.  If you test positive for Covid or have been in contact with anyone that has tested positive in the last 10 days please notify you surgeon.    Your procedure is scheduled on:  MONDAY  November 05, 2023  Report to [facility] Main Entrance: [address] entrance where the [employer] is available.   Report to admitting at:  08:15   AM  Call this number if you have any questions or problems the morning of surgery [phone]  FOLLOW ANY ADDITIONAL PRE OP INSTRUCTIONS YOU RECEIVED FROM YOUR SURGEON'S OFFICE!!!  Do not eat food after Midnight the night prior to your surgery/procedure.  After Midnight you may have the following liquids until  07:30  AM  DAY OF SURGERY  Clear Liquid Diet Water Black Coffee (sugar ok, NO MILK/CREAM OR CREAMERS)  Tea (sugar ok, NO MILK/CREAM OR CREAMERS) regular and decaf                              Plain Jell-O  with no fruit (NO RED)                                           Fruit ices (not with fruit pulp, NO RED)                                     Popsicles (NO RED)                                                                  Juice: NO CITRUS JUICES: only apple, WHITE grape, WHITE cranberry Sports drinks like Gatorade or Powerade (NO RED)               Oral Hygiene is also important to reduce your risk of infection.        Remember - BRUSH YOUR TEETH THE MORNING OF SURGERY WITH YOUR REGULAR TOOTHPASTE  Do  NOT smoke after Midnight the night before surgery.  STOP TAKING all Vitamins, Herbs and supplements 1 week before your surgery.   Take ONLY these medicines the morning of surgery with A SIP OF WATER: escitalopram, bupropion.                 You may not have any metal on your body including  jewelry, and body piercing  Do not wear lotions, powders, cologne, or deodorant  Men may shave face and neck.  Contacts, Hearing Aids, dentures or bridgework may not be worn into surgery. DENTURES WILL BE REMOVED PRIOR TO SURGERY PLEASE DO NOT APPLY "Poly grip" OR ADHESIVES!!!  You may bring a small overnight bag with you on the day of surgery, only pack items that are not valuable. [facility] IS NOT RESPONSIBLE   FOR VALUABLES THAT ARE LOST OR STOLEN.   Patients discharged on the day of surgery will not be allowed to drive home.  Someone NEEDS to stay with you for the first 24 hours after anesthesia.  Do not bring your home medications to the hospital. The Pharmacy will dispense medications listed on your medication list to you during your admission in the Hospital.  Please read over the following fact sheets you were given: IF YOU HAVE QUESTIONS ABOUT YOUR PRE-OP INSTRUCTIONS, PLEASE CALL [phone]   [facility] - Preparing for Surgery Before surgery, you can play an important role.  Because skin is not sterile, your skin needs to be as free of germs as possible.  You can  reduce the number of germs on your skin by washing with CHG (chlorahexidine gluconate) soap before surgery.  CHG is an antiseptic cleaner which kills germs and bonds with the skin to continue killing germs even after washing. Please DO NOT use if you have an allergy to CHG or antibacterial soaps.  If your skin becomes reddened/irritated stop using the CHG and inform your nurse when you arrive at Short Stay. Do not shave (including legs and underarms) for at least 48 hours prior to the first CHG shower.  You may shave your face/neck.  Please follow these instructions carefully:  1.  Shower with CHG Soap the night before surgery and the  morning of surgery.  2.  If you choose to wash your hair, wash your hair first as usual with your normal  shampoo.  3.  After you shampoo, rinse your hair and body thoroughly to remove the shampoo.                             4.  Use CHG as you would any other liquid soap.  You can apply chg directly to the skin and wash.  Gently with a scrungie or clean washcloth.  5.  Apply the CHG Soap to your body ONLY FROM THE NECK DOWN.   Do not use on face/ open                           Wound or open sores. Avoid contact with eyes, ears mouth and genitals (private parts).                       Wash face,  Genitals (private parts) with your normal soap.             6.  Wash thoroughly, paying special attention to the area where your  surgery  will be performed.  7.  Thoroughly rinse your body with warm water from the neck down.  8.  DO NOT shower/wash with your normal soap after using and rinsing off the CHG Soap.            9.  Pat yourself dry with a clean towel.            10.  Wear clean pajamas.            11.  Place clean sheets on your bed the night of your first shower and do not  sleep with pets.  ON THE DAY OF SURGERY : Do not apply any lotions/deodorants the morning of surgery.  Please wear clean clothes to the hospital/surgery center.     FAILURE TO FOLLOW  THESE INSTRUCTIONS MAY RESULT IN THE CANCELLATION OF YOUR SURGERY  PATIENT SIGNATURE_________________________________  NURSE SIGNATURE__________________________________  ________________________________________________________________________

## 2023-11-01 ENCOUNTER — Encounter (HOSPITAL_COMMUNITY): Payer: Self-pay

## 2023-11-01 ENCOUNTER — Encounter (HOSPITAL_COMMUNITY)
Admission: RE | Admit: 2023-11-01 | Discharge: 2023-11-01 | Disposition: A | Discharge status: Home / Self Care | Source: Ambulatory Visit | Attending: General Surgery | Admitting: General Surgery

## 2023-11-01 ENCOUNTER — Other Ambulatory Visit: Payer: Self-pay

## 2023-11-01 VITALS — BP 148/108 | HR 68 | Temp 98.7°F | Resp 14 | Ht 74.0 in | Wt 188.0 lb

## 2023-11-01 DIAGNOSIS — K760 Fatty (change of) liver, not elsewhere classified: Secondary | ICD-10-CM | POA: Diagnosis not present

## 2023-11-01 DIAGNOSIS — Z01818 Encounter for other preprocedural examination: Secondary | ICD-10-CM | POA: Diagnosis present

## 2023-11-01 DIAGNOSIS — R7989 Other specified abnormal findings of blood chemistry: Secondary | ICD-10-CM | POA: Insufficient documentation

## 2023-11-01 DIAGNOSIS — F17290 Nicotine dependence, other tobacco product, uncomplicated: Secondary | ICD-10-CM | POA: Insufficient documentation

## 2023-11-01 DIAGNOSIS — I10 Essential (primary) hypertension: Secondary | ICD-10-CM | POA: Diagnosis not present

## 2023-11-01 DIAGNOSIS — F419 Anxiety disorder, unspecified: Secondary | ICD-10-CM | POA: Diagnosis not present

## 2023-11-01 DIAGNOSIS — F1090 Alcohol use, unspecified, uncomplicated: Secondary | ICD-10-CM | POA: Insufficient documentation

## 2023-11-01 HISTORY — DX: Essential (primary) hypertension: I10

## 2023-11-01 HISTORY — DX: Other specified abnormal findings of blood chemistry: R79.89

## 2023-11-01 HISTORY — DX: Fatty (change of) liver, not elsewhere classified: K76.0

## 2023-11-01 LAB — COMPREHENSIVE METABOLIC PANEL WITH GFR
ALT: 45 U/L — ABNORMAL HIGH (ref 0–44)
AST: 46 U/L — ABNORMAL HIGH (ref 15–41)
Albumin: 4.3 g/dL (ref 3.5–5.0)
Alkaline Phosphatase: 46 U/L (ref 38–126)
Anion gap: 6 (ref 5–15)
BUN: 8 mg/dL (ref 6–20)
CO2: 26 mmol/L (ref 22–32)
Calcium: 9.2 mg/dL (ref 8.9–10.3)
Chloride: 105 mmol/L (ref 98–111)
Creatinine, Ser: 0.8 mg/dL (ref 0.61–1.24)
GFR, Estimated: >60 mL/min (ref >60)
Glucose, Bld: 98 mg/dL (ref 70–99)
Potassium: 4.1 mmol/L (ref 3.5–5.1)
Sodium: 137 mmol/L (ref 135–145)
Total Bilirubin: 0.7 mg/dL (ref 0.0–1.2)
Total Protein: 7.7 g/dL (ref 6.5–8.1)

## 2023-11-01 LAB — CBC
HCT: 42.9 % (ref 39.0–52.0)
Hemoglobin: 14.5 g/dL (ref 13.0–17.0)
MCH: 32.1 pg (ref 26.0–34.0)
MCHC: 33.8 g/dL (ref 30.0–36.0)
MCV: 94.9 fL (ref 80.0–100.0)
Platelets: 219 10*3/uL (ref 150–400)
RBC: 4.52 MIL/uL (ref 4.22–5.81)
RDW: 12.1 % (ref 11.5–15.5)
WBC: 3.7 10*3/uL — ABNORMAL LOW (ref 4.0–10.5)
nRBC: 0 % (ref 0.0–0.2)

## 2023-11-02 NOTE — Progress Notes (Signed)
 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  Blood pressure noted to be significantly elevated at PAT, 138/114 initially and 148/108 on recheck. Notably it was also documented as 155/110 when seen by Dr. Hillery Hunter on 10/12/23.He says he has a history of elevated BP but has never been on meds. He does report he has noticed it has been creeping up over time. Pt denies any associated symptoms. EKG shows NSR 70bpm. Discussed he's at the point where he will likely require antiHTN meds. He was instructed to reach out to his PCP today. He was also advised that markedly uncontrolled BP on DOS could be cause for cancellation.   Proep labs reviewed, very mildly elevated AS/ALT 46/45, otherwise unremarkable.   Zannie Cove [facility] Short Stay Center/Anesthesiology Phone [phone] 11/02/2023 8:46 AM

## 2023-11-02 NOTE — Anesthesia Preprocedure Evaluation (Addendum)
 Anesthesia Evaluation  Patient identified by MRN, date of birth, ID band Patient awake    Reviewed: Allergy & Precautions, H&P , NPO status , Patient's Chart, lab work & pertinent test results  Airway Mallampati: II  TM Distance: >3 FB Neck ROM: Full    Dental no notable dental hx.    Pulmonary neg pulmonary ROS, former smoker   Pulmonary exam normal breath sounds clear to auscultation       Cardiovascular hypertension, Pt. on medications negative cardio ROS Normal cardiovascular exam Rhythm:Regular Rate:Normal     Neuro/Psych   Anxiety     negative neurological ROS  negative psych ROS   GI/Hepatic negative GI ROS, Neg liver ROS,,,  Endo/Other  negative endocrine ROS    Renal/GU negative Renal ROS  negative genitourinary   Musculoskeletal negative musculoskeletal ROS (+)    Abdominal   Peds negative pediatric ROS (+)  Hematology negative hematology ROS (+)   Anesthesia Other Findings   Reproductive/Obstetrics negative OB ROS                             Anesthesia Physical Anesthesia Plan  ASA: 2  Anesthesia Plan: General   Post-op Pain Management: Regional block*, Tylenol PO (pre-op)*, Celebrex PO (pre-op)* and Gabapentin PO (pre-op)*   Induction: Intravenous  PONV Risk Score and Plan: 2 and Ondansetron, Midazolam and Treatment may vary due to age or medical condition  Airway Management Planned: Oral ETT  Additional Equipment:   Intra-op Plan:   Post-operative Plan: Extubation in OR  Informed Consent: I have reviewed the patients History and Physical, chart, labs and discussed the procedure including the risks, benefits and alternatives for the proposed anesthesia with the patient or authorized representative who has indicated his/her understanding and acceptance.     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  Blood pressure noted to be significantly elevated at PAT, 138/114 initially and 148/108 on recheck. Notably it was also documented as 155/110 when seen by Dr. Davonna Estes on 10/12/23.He says he has a history of elevated BP but has never been on meds. He does report he has noticed it has been creeping up over time. Pt denies any associated symptoms. EKG shows NSR 70bpm. Discussed he's at the point where he will likely require antiHTN meds. He was instructed to reach out to his PCP today. He was also advised that markedly uncontrolled BP on DOS could be cause for cancellation.   Proep labs reviewed, very mildly elevated AS/ALT 46/45, otherwise unremarkable.  )        Anesthesia Quick Evaluation

## 2023-11-05 ENCOUNTER — Other Ambulatory Visit (HOSPITAL_COMMUNITY): Payer: Self-pay

## 2023-11-05 ENCOUNTER — Ambulatory Visit (HOSPITAL_COMMUNITY)

## 2023-11-05 ENCOUNTER — Ambulatory Visit (HOSPITAL_COMMUNITY)
Admission: RE | Admit: 2023-11-05 | Discharge: 2023-11-05 | Disposition: A | Discharge status: Home / Self Care | Attending: General Surgery | Admitting: General Surgery

## 2023-11-05 ENCOUNTER — Encounter (HOSPITAL_COMMUNITY): Payer: Self-pay | Admitting: General Surgery

## 2023-11-05 ENCOUNTER — Other Ambulatory Visit: Payer: Self-pay

## 2023-11-05 ENCOUNTER — Ambulatory Visit (HOSPITAL_COMMUNITY): Payer: Self-pay | Admitting: Physician Assistant

## 2023-11-05 ENCOUNTER — Encounter (HOSPITAL_COMMUNITY)
Admission: RE | Disposition: A | Discharge status: Home / Self Care | Payer: Self-pay | Source: Home / Self Care | Attending: General Surgery

## 2023-11-05 DIAGNOSIS — K589 Irritable bowel syndrome without diarrhea: Secondary | ICD-10-CM | POA: Insufficient documentation

## 2023-11-05 DIAGNOSIS — K4090 Unilateral inguinal hernia, without obstruction or gangrene, not specified as recurrent: Secondary | ICD-10-CM | POA: Insufficient documentation

## 2023-11-05 DIAGNOSIS — Z87891 Personal history of nicotine dependence: Secondary | ICD-10-CM | POA: Diagnosis not present

## 2023-11-05 DIAGNOSIS — K760 Fatty (change of) liver, not elsewhere classified: Secondary | ICD-10-CM | POA: Insufficient documentation

## 2023-11-05 DIAGNOSIS — I10 Essential (primary) hypertension: Secondary | ICD-10-CM | POA: Diagnosis not present

## 2023-11-05 SURGERY — HERNIORRHAPHY, INGUINAL, ROBOT-ASSISTED, LAPAROSCOPIC
Anesthesia: General | Laterality: Right

## 2023-11-05 MED ORDER — ACETAMINOPHEN 325 MG PO TABS: 650.0000 mg | ORAL_TABLET | Freq: Four times a day (QID) | ORAL | 0 refills | Status: DC

## 2023-11-05 MED ORDER — AMISULPRIDE (ANTIEMETIC) 5 MG/2ML IV SOLN: 10.0000 mg | Freq: Once | INTRAVENOUS | Status: DC | PRN

## 2023-11-05 MED ORDER — OXYCODONE HCL 5 MG PO TABS: 5.0000 mg | ORAL_TABLET | ORAL | Status: DC | PRN

## 2023-11-05 MED ORDER — OXYCODONE HCL 5 MG PO TABS
5.0000 mg | ORAL_TABLET | Freq: Three times a day (TID) | ORAL | 0 refills | Status: AC | PRN
  Filled 2023-11-05: qty 12, 4d supply, fill #0

## 2023-11-05 MED ORDER — MIDAZOLAM HCL 2 MG/2ML IJ SOLN
INTRAMUSCULAR | Status: AC
  Administered 2023-11-05: 1.5 mg
  Filled 2023-11-05: qty 2

## 2023-11-05 MED ORDER — MEPERIDINE HCL 50 MG/ML IJ SOLN: 6.2500 mg | INTRAMUSCULAR | Status: DC | PRN

## 2023-11-05 MED ORDER — ACETAMINOPHEN 325 MG PO TABS
650.0000 mg | ORAL_TABLET | Freq: Four times a day (QID) | ORAL | 0 refills | Status: AC
  Filled 2023-11-05: qty 50, 7d supply, fill #0

## 2023-11-05 MED ORDER — FENTANYL CITRATE PF 50 MCG/ML IJ SOSY
PREFILLED_SYRINGE | INTRAMUSCULAR | Status: AC
  Administered 2023-11-05: 100 ug
  Filled 2023-11-05: qty 2

## 2023-11-05 MED ORDER — PROPOFOL 10 MG/ML IV BOLUS
INTRAVENOUS | Status: DC | PRN
  Administered 2023-11-05: 170 mg via INTRAVENOUS

## 2023-11-05 MED ORDER — OXYCODONE HCL 5 MG PO TABS: 5.0000 mg | ORAL_TABLET | Freq: Three times a day (TID) | ORAL | 0 refills | Status: DC | PRN

## 2023-11-05 MED ORDER — ROCURONIUM BROMIDE 10 MG/ML (PF) SYRINGE
PREFILLED_SYRINGE | INTRAVENOUS | Status: DC | PRN
  Administered 2023-11-05: 90 mg via INTRAVENOUS
  Administered 2023-11-05: 20 mg via INTRAVENOUS
  Administered 2023-11-05 (×2): 10 mg via INTRAVENOUS

## 2023-11-05 MED ORDER — CEFAZOLIN SODIUM-DEXTROSE 2-4 GM/100ML-% IV SOLN
2.0000 g | INTRAVENOUS | Status: AC
  Administered 2023-11-05: 2 g via INTRAVENOUS
  Filled 2023-11-05: qty 100

## 2023-11-05 MED ORDER — ACETAMINOPHEN 650 MG RE SUPP: 650.0000 mg | RECTAL | Status: DC | PRN

## 2023-11-05 MED ORDER — BUPIVACAINE LIPOSOME 1.3 % IJ SUSP
INTRAMUSCULAR | Status: AC
  Filled 2023-11-05: qty 20

## 2023-11-05 MED ORDER — LIDOCAINE HCL (CARDIAC) PF 100 MG/5ML IV SOSY
PREFILLED_SYRINGE | INTRAVENOUS | Status: DC | PRN
  Administered 2023-11-05: 80 mg via INTRAVENOUS

## 2023-11-05 MED ORDER — DEXMEDETOMIDINE HCL IN NACL 80 MCG/20ML IV SOLN
INTRAVENOUS | Status: DC | PRN
  Administered 2023-11-05: 8 ug via INTRAVENOUS

## 2023-11-05 MED ORDER — LACTATED RINGERS IV SOLN: INTRAVENOUS | Status: DC

## 2023-11-05 MED ORDER — FENTANYL CITRATE (PF) 100 MCG/2ML IJ SOLN
INTRAMUSCULAR | Status: AC
  Filled 2023-11-05: qty 2

## 2023-11-05 MED ORDER — BUPIVACAINE LIPOSOME 1.3 % IJ SUSP: 20.0000 mL | Freq: Once | INTRAMUSCULAR | Status: DC

## 2023-11-05 MED ORDER — HYDROMORPHONE HCL 1 MG/ML IJ SOLN: 0.2500 mg | INTRAMUSCULAR | Status: DC | PRN

## 2023-11-05 MED ORDER — ACETAMINOPHEN 325 MG PO TABS: 650.0000 mg | ORAL_TABLET | ORAL | Status: DC | PRN

## 2023-11-05 MED ORDER — CHLORHEXIDINE GLUCONATE CLOTH 2 % EX PADS: 6.0000 | MEDICATED_PAD | Freq: Once | CUTANEOUS | Status: DC

## 2023-11-05 MED ORDER — IBUPROFEN 200 MG PO TABS
600.0000 mg | ORAL_TABLET | Freq: Four times a day (QID) | ORAL | 0 refills | Status: AC
  Filled 2023-11-05: qty 75, 7d supply, fill #0

## 2023-11-05 MED ORDER — IBUPROFEN 200 MG PO TABS: 600.0000 mg | ORAL_TABLET | Freq: Four times a day (QID) | ORAL | 0 refills | Status: DC

## 2023-11-05 MED ORDER — ORAL CARE MOUTH RINSE: 15.0000 mL | Freq: Once | OROMUCOSAL | Status: AC

## 2023-11-05 MED ORDER — BUPIVACAINE LIPOSOME 1.3 % IJ SUSP
INTRAMUSCULAR | Status: DC | PRN
  Administered 2023-11-05: 20 mL

## 2023-11-05 MED ORDER — SODIUM CHLORIDE 0.9 % IV SOLN: 12.5000 mg | INTRAVENOUS | Status: DC | PRN

## 2023-11-05 MED ORDER — GABAPENTIN 300 MG PO CAPS
300.0000 mg | ORAL_CAPSULE | ORAL | Status: AC
  Administered 2023-11-05: 300 mg via ORAL
  Filled 2023-11-05: qty 3

## 2023-11-05 MED ORDER — MORPHINE SULFATE (PF) 2 MG/ML IV SOLN: 1.0000 mg | INTRAVENOUS | Status: DC | PRN

## 2023-11-05 MED ORDER — SODIUM CHLORIDE 0.9% FLUSH: 3.0000 mL | Freq: Two times a day (BID) | INTRAVENOUS | Status: DC

## 2023-11-05 MED ORDER — CELECOXIB 200 MG PO CAPS
200.0000 mg | ORAL_CAPSULE | ORAL | Status: AC
  Administered 2023-11-05: 200 mg via ORAL
  Filled 2023-11-05: qty 1

## 2023-11-05 MED ORDER — SODIUM CHLORIDE 0.9 % IV SOLN: 250.0000 mL | INTRAVENOUS | Status: DC | PRN

## 2023-11-05 MED ORDER — SODIUM CHLORIDE 0.9% FLUSH: 3.0000 mL | INTRAVENOUS | Status: DC | PRN

## 2023-11-05 MED ORDER — MIDAZOLAM HCL 2 MG/2ML IJ SOLN
INTRAMUSCULAR | Status: AC
  Filled 2023-11-05: qty 2

## 2023-11-05 MED ORDER — 0.9 % SODIUM CHLORIDE (POUR BTL) OPTIME
TOPICAL | Status: DC | PRN
  Administered 2023-11-05: 1000 mL

## 2023-11-05 MED ORDER — FENTANYL CITRATE (PF) 100 MCG/2ML IJ SOLN
INTRAMUSCULAR | Status: DC | PRN
  Administered 2023-11-05 (×2): 50 ug via INTRAVENOUS

## 2023-11-05 MED ORDER — SUGAMMADEX SODIUM 200 MG/2ML IV SOLN
INTRAVENOUS | Status: DC | PRN
  Administered 2023-11-05: 200 mg via INTRAVENOUS

## 2023-11-05 MED ORDER — ACETAMINOPHEN 500 MG PO TABS
1000.0000 mg | ORAL_TABLET | ORAL | Status: AC
  Administered 2023-11-05: 1000 mg via ORAL
  Filled 2023-11-05: qty 2

## 2023-11-05 MED ORDER — MIDAZOLAM HCL 2 MG/2ML IJ SOLN
INTRAMUSCULAR | Status: DC | PRN
  Administered 2023-11-05: 1 mg via INTRAVENOUS

## 2023-11-05 MED ORDER — ROPIVACAINE HCL 5 MG/ML IJ SOLN
INTRAMUSCULAR | Status: DC | PRN
  Administered 2023-11-05: 30 mL via PERINEURAL

## 2023-11-05 MED ORDER — DEXAMETHASONE SODIUM PHOSPHATE 10 MG/ML IJ SOLN
INTRAMUSCULAR | Status: DC | PRN
  Administered 2023-11-05: 8 mg via INTRAVENOUS

## 2023-11-05 MED ORDER — ONDANSETRON HCL 4 MG/2ML IJ SOLN
INTRAMUSCULAR | Status: DC | PRN
  Administered 2023-11-05: 4 mg via INTRAVENOUS

## 2023-11-05 MED ORDER — CHLORHEXIDINE GLUCONATE 0.12 % MT SOLN
15.0000 mL | Freq: Once | OROMUCOSAL | Status: AC
  Administered 2023-11-05: 15 mL via OROMUCOSAL

## 2023-11-05 SURGICAL SUPPLY — 50 items
ANTIFOG SOL W/FOAM PAD STRL (MISCELLANEOUS) ×1 IMPLANT
APPLICATOR COTTON TIP 6 STRL (MISCELLANEOUS) IMPLANT
APPLICATOR COTTON TIP 6IN STRL (MISCELLANEOUS) IMPLANT
BAG COUNTER SPONGE SURGICOUNT (BAG) IMPLANT
BLADE SURG SZ11 CARB STEEL (BLADE) ×1 IMPLANT
CHLORAPREP W/TINT 26 (MISCELLANEOUS) ×1 IMPLANT
COVER MAYO STAND STRL (DRAPES) ×1 IMPLANT
COVER SURGICAL LIGHT HANDLE (MISCELLANEOUS) ×1 IMPLANT
COVER TIP SHEARS 8 DVNC (MISCELLANEOUS) ×1 IMPLANT
DERMABOND ADVANCED .7 DNX12 (GAUZE/BANDAGES/DRESSINGS) ×1 IMPLANT
DRAPE ARM DVNC X/XI (DISPOSABLE) ×3 IMPLANT
DRAPE COLUMN DVNC XI (DISPOSABLE) ×1 IMPLANT
DRIVER NDL MEGA SUTCUT DVNCXI (INSTRUMENTS) ×1 IMPLANT
DRIVER NDLE MEGA SUTCUT DVNCXI (INSTRUMENTS) ×1 IMPLANT
ELECT REM PT RETURN 15FT ADLT (MISCELLANEOUS) ×1 IMPLANT
FORCEPS BPLR 8 MD DVNC XI (FORCEP) ×1 IMPLANT
GAUZE 4X4 16PLY ~~LOC~~+RFID DBL (SPONGE) ×1 IMPLANT
GLOVE BIO SURGEON STRL SZ7 (GLOVE) ×2 IMPLANT
GOWN STRL REUS W/ TWL XL LVL3 (GOWN DISPOSABLE) ×2 IMPLANT
IRRIG SUCT STRYKERFLOW 2 WTIP (MISCELLANEOUS) IMPLANT
IRRIGATION SUCT STRKRFLW 2 WTP (MISCELLANEOUS) IMPLANT
KIT BASIN OR (CUSTOM PROCEDURE TRAY) ×1 IMPLANT
KIT TURNOVER KIT A (KITS) IMPLANT
MARKER SKIN DUAL TIP RULER LAB (MISCELLANEOUS) ×1 IMPLANT
MESH 3DMAX MID 5X7 RT XLRG (Mesh General) IMPLANT
NDL HYPO 22X1.5 SAFETY MO (MISCELLANEOUS) ×1 IMPLANT
NDL INSUFFLATION 14GA 120MM (NEEDLE) ×1 IMPLANT
NEEDLE HYPO 22X1.5 SAFETY MO (MISCELLANEOUS) ×1 IMPLANT
NEEDLE INSUFFLATION 14GA 120MM (NEEDLE) ×1 IMPLANT
OBTURATOR OPTICAL STND 8 DVNC (TROCAR) ×1 IMPLANT
OBTURATOR OPTICALSTD 8 DVNC (TROCAR) ×1 IMPLANT
PACK CARDIOVASCULAR III (CUSTOM PROCEDURE TRAY) ×1 IMPLANT
SCISSORS MNPLR CVD DVNC XI (INSTRUMENTS) ×1 IMPLANT
SEAL UNIV 5-12 XI (MISCELLANEOUS) ×3 IMPLANT
SOL ELECTROSURG ANTI STICK (MISCELLANEOUS) ×1 IMPLANT
SOLUTION ANTFG W/FOAM PAD STRL (MISCELLANEOUS) ×1 IMPLANT
SOLUTION ELECTROSURG ANTI STCK (MISCELLANEOUS) ×1 IMPLANT
SPIKE FLUID TRANSFER (MISCELLANEOUS) ×1 IMPLANT
SUT MNCRL AB 4-0 PS2 18 (SUTURE) ×1 IMPLANT
SUT STRATA PDS 2-0 23 CT-1 (SUTURE) IMPLANT
SUT STRATAFIX SPIRAL PDS3-0 (SUTURE) ×1 IMPLANT
SUT VIC AB 2-0 SH 27X BRD (SUTURE) ×1 IMPLANT
SUT VIC AB 3-0 SH 27X BRD (SUTURE) IMPLANT
SYR 10ML LL (SYRINGE) ×1 IMPLANT
SYR 20ML LL LF (SYRINGE) ×1 IMPLANT
TAPE STRIPS DRAPE STRL (GAUZE/BANDAGES/DRESSINGS) ×1 IMPLANT
TOWEL GREEN STERILE FF (TOWEL DISPOSABLE) ×1 IMPLANT
TOWEL OR 17X26 10 PK STRL BLUE (TOWEL DISPOSABLE) ×1 IMPLANT
TROCAR Z-THREAD OPTICAL 5X100M (TROCAR) IMPLANT
TUBING INSUFFLATION 10FT LAP (TUBING) ×1 IMPLANT

## 2023-11-05 NOTE — Anesthesia Postprocedure Evaluation (Signed)
 Anesthesia Post Note  Patient: Kevin Wu  Procedure(s) Performed: RIGHT HERNIORRHAPHY, INGUINAL, ROBOT-ASSISTED, LAPAROSCOPIC (Right)     Patient location during evaluation: PACU Anesthesia Type: General Level of consciousness: awake and alert Pain management: pain level controlled Vital Signs Assessment: post-procedure vital signs reviewed and stable Respiratory status: spontaneous breathing, nonlabored ventilation and respiratory function stable Cardiovascular status: blood pressure returned to baseline and stable Postop Assessment: no apparent nausea or vomiting Anesthetic complications: no   No notable events documented.  Last Vitals:  Vitals:   11/05/23 1245 11/05/23 1308  BP: (!) 134/91 (!) 141/85  Pulse: 74 89  Resp: 14 16  Temp: 36.9 C 36.7 C  SpO2: 95% 97%    Last Pain:  Vitals:   11/05/23 1308  TempSrc: Oral  PainSc: 2                  Earvin Goldberg

## 2023-11-05 NOTE — Transfer of Care (Signed)
 Immediate Anesthesia Transfer of Care Note  Patient: Kevin Wu  Procedure(s) Performed: RIGHT HERNIORRHAPHY, INGUINAL, ROBOT-ASSISTED, LAPAROSCOPIC (Right)  Patient Location: PACU  Anesthesia Type:General  Level of Consciousness: awake, alert , and oriented  Airway & Oxygen Therapy: Patient Spontanous Breathing and Patient connected to face mask oxygen  Post-op Assessment: Report given to RN, Post -op Vital signs reviewed and stable, and Patient moving all extremities X 4  Post vital signs: Reviewed and stable  Last Vitals:  Vitals Value Taken Time  BP 144/103   Temp    Pulse 97   Resp 12   SpO2 100     Last Pain:  Vitals:   11/05/23 1015  TempSrc:   PainSc: 0-No pain         Complications: No notable events documented.

## 2023-11-05 NOTE — Op Note (Signed)
 Kevin Wu ([id number])  Operative Report   Date 11/05/2023  PREOPERATIVE DIAGNOSIS: Right Inguinal hernia  POSTOPERATIVE DIAGNOSIS: Same  PROCEDURE:  Robotic Right Inguinal Hernia Repair with Mesh (Bard 3D Midweight XL Right Polypropylene Mesh)  SURGEON: Armond Bertin, MD  ANESTHESIA: General Anesthesia    INTRAOPERATIVE FINDINGS: Indirect right inguinal hernia  IMPLANTS: Implant Name Type Inv. Item Serial No. Manufacturer Lot No. LRB No. Used Action  MESH 3DMAX MID 5X7 RT XLRG - [id number] Mesh General MESH 3DMAX MID 5X7 RT [employer] BARD ACCESS [id number] Right 1 Implanted    ESTIMATED BLOOD LOSS: Minimal  COMPLICATIONS: None  SPECIMENS: None  OPERATIVE INDICATIONS: Pt is a 43 y.o. male who presents with a right inguinal hernia.  The patient desires definitive repair.  The procedure's risks, benefits, and alternatives were explained to the patient.  Risks, including the risks of bleeding, infection, need for mesh removal, and potential for hernia recurrence, were discussed.  The patient agreed to proceed and signed informed consent in front of a witness.   DESCRIPTION OF PROCEDURE: After preoperatively identifying the patient in holding, the patient was brought to the operating room and placed supine on the operating room table.  Both arms were tucked and padded to avoid potential nerve injury.  Sequential Compression Devices were placed bilaterally.  After induction of general anesthesia, the patient was given the appropriate perioperative antibiotics.  The abdomen was prepped and draped in a typical sterile fashion.  A JACHO approved time out, where the name of the patient, the operation, and the intended site were confirmed. The abdomen was accessed with a Veress needle via the standard drop technique in the LUQ at Palmer's point.  Insufflation was established.  An 8 mm optical port was placed under direct visualization in the RLQ.  A camera was introduced into the  abdomen, and a thorough inspection of the abdomen was performed to confirm there was no additional pathology.  Two additional 8 mm robotic ports were placed laterally under direct visualization, one superior to the umbilicus and another in the RLQ. The patient was then placed into 15-degree Trendelenburg position to facilitate examination of the bilateral inguinal spaces.  A thorough inspection of the abdomen was undertaken.  A right inguinal hernia was identified and amenable to robotic repair.       The [employer] robot was brought into the field and docked.  An 8 mm 30-degree scope was placed in the mid-abdominal port.  The peritoneum was taken down 6 cm superior to the hernia from the anterior abdominal wall, and dissection was taken inferiorly towards the hernia.  Medial to the epigastric vessels, the parietal compartment was dissected and completed to visualize the rectus muscle.  This dissection was carried down to the symphysis pubis and obturator foramen.  The retropubic space was dissected to expose at least 2 cm contralateral to the midline.  Cooper's ligament was exposed and cleared at least 2 cm below the ligament to ensure adequate space for the inferior border of the mesh.  Hesselbach's triangle was cleared, assessing for a direct hernia. There was no direct hernia. Any herniated femoral contents were reduced as well.  Lateral to the epigastric vessels, the dissection was carried out into the visceral compartment, continuing in the true preperitoneal plane.  The indirect hernia sac was carefully reduced and separated from the cord structures with medial retraction and a combination of blunt/sharp dissection and focused cautery.  This dissection continued until the cord structures were parietalized  completely, allowing for continuous visualization of the reflected peritoneum with the line originating 2 cm below Cooper's medially and across the psoas muscle in the lateral compartment.  The internal  ring was interrogated for a cord lipoma.  There was no evidence of a cord lipoma.   Having achieved a complete dissection with a critical view of the entire myopectineal orifice, a piece of Bard 3D Midweight XL Right Polypropylene Mesh was introduced into the field.  It was centered at the iliopubic tract, with the medial side crossing the midline and the inferior edge positioned 2 cm below Cooper's ligament.  With complete coverage of the myopectineal orifice, the inferior edge of the peritoneum was posterior and inferior to the mesh.  The lateral aspect of the mesh extended 3-5 cm beyond the lateral edge of the psoas.  Cephalad retraction of the peritoneal flap did not cause lifting of the inferior mesh edge or cord structures.  The mesh was fixated using an interrupted 3-0 Vicryl suture placed to the ipsilateral Coopers ligament.  The peritoneal flap was closed with a running 3-0 Stratafix barbed suture.  Additional holes in the peritoneum closed.  Hemostasis was assured in the entirety of the abdomen.  The two lateral ports were removed under direct visualization.  The abdomen was desufflated under direct visualization.  The port sites were closed, local anesthetic was infiltrated, and Dermabond was applied.  After confirming twice that the sponge, needle, and instrument counts were correct, the procedure was terminated, the patient was extubated and transferred to the recovery room in stable condition.     DISPOSITION: Stable to PACU.   Electronically Signed By: Cannon Champion 11/05/2023 2:32 PM

## 2023-11-05 NOTE — Discharge Instructions (Signed)
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
## 2023-11-05 NOTE — Anesthesia Procedure Notes (Signed)
 Anesthesia Regional Block: TAP block   Pre-Anesthetic Checklist: , timeout performed,  Correct Patient, Correct Site, Correct Laterality,  Correct Procedure, Correct Position, site marked,  Risks and benefits discussed,  Surgical consent,  Pre-op evaluation,  At surgeon's request and post-op pain management  Laterality: Right  Prep: chloraprep       Needles:  Injection technique: Single-shot  Needle Type: Stimiplex     Needle Length: 9cm  Needle Gauge: 21     Additional Needles:   Procedures:,,,, ultrasound used (permanent image in chart),,    Narrative:  Start time: 11/05/2023 10:15 AM End time: 11/05/2023 10:20 AM Injection made incrementally with aspirations every 5 mL.  Performed by: Personally  Anesthesiologist: Earvin Goldberg, MD

## 2023-11-06 ENCOUNTER — Ambulatory Visit (HOSPITAL_COMMUNITY): Admit: 2023-11-06 | Admitting: Internal Medicine

## 2023-11-06 ENCOUNTER — Encounter (HOSPITAL_COMMUNITY): Payer: Self-pay

## 2023-11-06 SURGERY — COLONOSCOPY
Anesthesia: Choice

## 2023-11-08 ENCOUNTER — Other Ambulatory Visit: Payer: Self-pay | Admitting: *Deleted

## 2023-11-08 ENCOUNTER — Encounter: Payer: Self-pay | Admitting: *Deleted

## 2023-11-08 MED ORDER — PEG 3350-KCL-NA BICARB-NACL 420 G PO SOLR: 4000.0000 mL | Freq: Once | ORAL | 0 refills | Status: AC

## 2023-11-08 NOTE — Telephone Encounter (Signed)
 Called pt to reschedule procedure. He was given dates in May. He will get with his wife to see which date would work best for him, then will call back.

## 2023-11-08 NOTE — Telephone Encounter (Signed)
 Pt has been rescheduled for 12/18/23. Instructions mailed and prep sent to pharmacy

## 2023-11-16 ENCOUNTER — Telehealth: Payer: Self-pay

## 2023-11-16 NOTE — Telephone Encounter (Signed)
 Documentation from [facility] regarding pt's Mesalamine  1.2GM on your desk in yellow folder. I have no idea what it is about because no one put any notes in. Please advise

## 2023-11-22 ENCOUNTER — Other Ambulatory Visit: Payer: Self-pay | Admitting: Gastroenterology

## 2023-11-22 MED ORDER — MESALAMINE 1.2 G PO TBEC: 2.4000 g | DELAYED_RELEASE_TABLET | Freq: Every morning | ORAL | 0 refills | Status: DC

## 2023-11-22 NOTE — Telephone Encounter (Signed)
 Completed.

## 2023-11-22 NOTE — Telephone Encounter (Signed)
 noted

## 2023-12-14 ENCOUNTER — Encounter (HOSPITAL_COMMUNITY)
Admission: RE | Admit: 2023-12-14 | Discharge: 2023-12-14 | Disposition: A | Discharge status: Home / Self Care | Source: Ambulatory Visit | Attending: Internal Medicine | Admitting: Internal Medicine

## 2023-12-14 ENCOUNTER — Encounter (HOSPITAL_COMMUNITY): Payer: Self-pay

## 2023-12-18 ENCOUNTER — Encounter (HOSPITAL_COMMUNITY): Payer: Self-pay | Admitting: Internal Medicine

## 2023-12-18 ENCOUNTER — Encounter (HOSPITAL_COMMUNITY)
Admission: RE | Disposition: A | Discharge status: Home / Self Care | Payer: Self-pay | Source: Home / Self Care | Attending: Internal Medicine

## 2023-12-18 ENCOUNTER — Ambulatory Visit (HOSPITAL_COMMUNITY): Admitting: Anesthesiology

## 2023-12-18 ENCOUNTER — Other Ambulatory Visit: Payer: Self-pay

## 2023-12-18 ENCOUNTER — Ambulatory Visit (HOSPITAL_COMMUNITY)
Admission: RE | Admit: 2023-12-18 | Discharge: 2023-12-18 | Disposition: A | Discharge status: Home / Self Care | Attending: Internal Medicine | Admitting: Internal Medicine

## 2023-12-18 DIAGNOSIS — K648 Other hemorrhoids: Secondary | ICD-10-CM | POA: Insufficient documentation

## 2023-12-18 DIAGNOSIS — K5010 Crohn's disease of large intestine without complications: Secondary | ICD-10-CM | POA: Diagnosis present

## 2023-12-18 DIAGNOSIS — K635 Polyp of colon: Secondary | ICD-10-CM

## 2023-12-18 DIAGNOSIS — I10 Essential (primary) hypertension: Secondary | ICD-10-CM | POA: Diagnosis not present

## 2023-12-18 DIAGNOSIS — Z87891 Personal history of nicotine dependence: Secondary | ICD-10-CM | POA: Diagnosis not present

## 2023-12-18 DIAGNOSIS — Z1211 Encounter for screening for malignant neoplasm of colon: Secondary | ICD-10-CM

## 2023-12-18 DIAGNOSIS — K5080 Crohn's disease of both small and large intestine without complications: Secondary | ICD-10-CM

## 2023-12-18 DIAGNOSIS — K633 Ulcer of intestine: Secondary | ICD-10-CM

## 2023-12-18 DIAGNOSIS — D125 Benign neoplasm of sigmoid colon: Secondary | ICD-10-CM | POA: Diagnosis not present

## 2023-12-18 HISTORY — PX: COLONOSCOPY: SHX5424

## 2023-12-18 SURGERY — COLONOSCOPY
Anesthesia: General

## 2023-12-18 MED ORDER — LACTATED RINGERS IV SOLN: INTRAVENOUS | Status: DC

## 2023-12-18 MED ORDER — PROPOFOL 500 MG/50ML IV EMUL
INTRAVENOUS | Status: DC | PRN
  Administered 2023-12-18: 150 ug/kg/min via INTRAVENOUS
  Administered 2023-12-18: 30 mg via INTRAVENOUS
  Administered 2023-12-18: 70 mg via INTRAVENOUS

## 2023-12-18 NOTE — Anesthesia Preprocedure Evaluation (Signed)
Anesthesia Evaluation  Patient identified by MRN, date of birth, ID band Patient awake    Reviewed: Allergy & Precautions, H&P , NPO status , Patient's Chart, lab work & pertinent test results, reviewed documented beta blocker date and time   Airway Mallampati: II  TM Distance: >3 FB Neck ROM: full    Dental no notable dental hx.    Pulmonary neg pulmonary ROS, former smoker   Pulmonary exam normal breath sounds clear to auscultation       Cardiovascular Exercise Tolerance: Good hypertension, negative cardio ROS  Rhythm:regular Rate:Normal     Neuro/Psych   Anxiety     negative neurological ROS  negative psych ROS   GI/Hepatic negative GI ROS, Neg liver ROS,,,  Endo/Other  negative endocrine ROS    Renal/GU negative Renal ROS  negative genitourinary   Musculoskeletal   Abdominal   Peds  Hematology negative hematology ROS (+)   Anesthesia Other Findings   Reproductive/Obstetrics negative OB ROS                             Anesthesia Physical Anesthesia Plan  ASA: 2  Anesthesia Plan: General   Post-op Pain Management:    Induction:   PONV Risk Score and Plan: Propofol infusion  Airway Management Planned:   Additional Equipment:   Intra-op Plan:   Post-operative Plan:   Informed Consent: I have reviewed the patients History and Physical, chart, labs and discussed the procedure including the risks, benefits and alternatives for the proposed anesthesia with the patient or authorized representative who has indicated his/her understanding and acceptance.     Dental Advisory Given  Plan Discussed with: CRNA  Anesthesia Plan Comments:        Anesthesia Quick Evaluation

## 2023-12-18 NOTE — Discharge Instructions (Addendum)
  Colonoscopy Discharge Instructions  Read the instructions outlined below and refer to this sheet in the next few weeks. These discharge instructions provide you with general information on caring for yourself after you leave the hospital. Your doctor may also give you specific instructions. While your treatment has been planned according to the most current medical practices available, unavoidable complications occasionally occur.   ACTIVITY You may resume your regular activity, but move at a slower pace for the next 24 hours.  Take frequent rest periods for the next 24 hours.  Walking will help get rid of the air and reduce the bloated feeling in your belly (abdomen).  No driving for 24 hours (because of the medicine (anesthesia) used during the test).   Do not sign any important legal documents or operate any machinery for 24 hours (because of the anesthesia used during the test).  NUTRITION Drink plenty of fluids.  You may resume your normal diet as instructed by your doctor.  Begin with a light meal and progress to your normal diet. Heavy or fried foods are harder to digest and may make you feel sick to your stomach (nauseated).  Avoid alcoholic beverages for 24 hours or as instructed.  MEDICATIONS You may resume your normal medications unless your doctor tells you otherwise.  WHAT YOU CAN EXPECT TODAY Some feelings of bloating in the abdomen.  Passage of more gas than usual.  Spotting of blood in your stool or on the toilet paper.  IF YOU HAD POLYPS REMOVED DURING THE COLONOSCOPY: No aspirin products for 7 days or as instructed.  No alcohol for 7 days or as instructed.  Eat a soft diet for the next 24 hours.  FINDING OUT THE RESULTS OF YOUR TEST Not all test results are available during your visit. If your test results are not back during the visit, make an appointment with your caregiver to find out the results. Do not assume everything is normal if you have not heard from your  caregiver or the medical facility. It is important for you to follow up on all of your test results.  SEEK IMMEDIATE MEDICAL ATTENTION IF: You have more than a spotting of blood in your stool.  Your belly is swollen (abdominal distention).  You are nauseated or vomiting.  You have a temperature over 101.  You have abdominal pain or discomfort that is severe or gets worse throughout the day.   Your colonoscopy revealed inflammation/ulceration of the ileocecal valve.  This is the valve that connects your small bowel to your colon.  I took samples of this area.  Remainder of the colon without inflammation.  I took samples of your entire colon as well.  1 small polyp which I removed.  We will call with all of these results to determine next steps.  Follow-up in GI office in 6 weeks.  I hope you have a great rest of your week!  Rolando Cliche. Mordechai April, D.O. Gastroenterology and Hepatology [facility]
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
## 2023-12-18 NOTE — Transfer of Care (Signed)
 Immediate Anesthesia Transfer of Care Note  Patient: Kevin Wu  Procedure(s) Performed: COLONOSCOPY  Patient Location: PACU and Short Stay  Anesthesia Type:General  Level of Consciousness: awake, alert , and oriented  Airway & Oxygen Therapy: Patient Spontanous Breathing  Post-op Assessment: Report given to RN and Post -op Vital signs reviewed and stable  Post vital signs: Reviewed and stable  Last Vitals:  Vitals Value Taken Time  BP 127/95 12/18/23 1158  Temp 36.6 C 12/18/23 1158  Pulse 69 12/18/23 1158  Resp 11 12/18/23 1158  SpO2 100 % 12/18/23 1158    Last Pain:  Vitals:   12/18/23 1158  TempSrc: Oral  PainSc: 0-No pain      Patients Stated Pain Goal: 7 (12/18/23 0928)  Complications: No notable events documented.

## 2023-12-18 NOTE — Op Note (Signed)
 [facility] Patient Name: Kevin Wu Procedure Date: 12/18/2023 11:17 AM MRN: [id number] Date of Birth: [birth year] Attending MD: Rolando Cliche. Mordechai April , [place], [id number] CSN: [id number] Age: 43 Admit Type: Outpatient Procedure:                Colonoscopy Indications:              Disease activity assessment of Crohn's disease of                            the colon Providers:                Rolando Cliche. Mordechai April, DO, Jessica Boudreaux, [employer], Technician Referring MD:              Medicines:                See the Anesthesia note for documentation of the                            administered medications Complications:            No immediate complications. Estimated Blood Loss:     Estimated blood loss was minimal. Procedure:                Pre-Anesthesia Assessment:                           - The anesthesia plan was to use monitored                            anesthesia care (MAC).                           After obtaining informed consent, the colonoscope                            was passed under direct vision. Throughout the                            procedure, the patient's blood pressure, pulse, and                            oxygen saturations were monitored continuously. The                            [id number]-HQ190L ([id number]) scope was introduced through                            the anus and advanced to the the terminal ileum,                            with identification of the appendiceal orifice and                            IC valve. The colonoscopy was performed without  difficulty. The patient tolerated the procedure                            well. The quality of the bowel preparation was                            evaluated using the BBPS [facility] Bowel Preparation                            Scale) with scores of: Right Colon = 3, Transverse                            Colon = 3 and Left Colon = 3  (entire mucosa seen                            well with no residual staining, small fragments of                            stool or opaque liquid). The total BBPS score                            equals 9. Scope In: 11:34:44 AM Scope Out: 11:53:42 AM Scope Withdrawal Time: 0 hours 14 minutes 44 seconds  Total Procedure Duration: 0 hours 18 minutes 58 seconds  Findings:      Non-bleeding internal hemorrhoids were found. The hemorrhoids were small.      The terminal ileum appeared normal. Biopsies were taken with a cold       forceps for histology.      Localized mild inflammation characterized by erythema and shallow       ulcerations was found at the ileocecal valve. Biopsies were taken with a       cold forceps for histology.      Otherwise, the colon (entire examined portion) appeared normal without       active inflammation. Segmental biopsies were taken with a cold forceps       for histology.      A 4 mm polyp was found in the sigmoid colon. The polyp was sessile. The       polyp was removed with a cold snare. Resection and retrieval were       complete. Impression:               - Non-bleeding internal hemorrhoids.                           - The examined portion of the ileum was normal.                            Biopsied.                           - Localized mild inflammation was found at the                            ileocecal valve. Biopsied.                           -  The entire examined colon is normal. Biopsied.                           - One 4 mm polyp in the sigmoid colon, removed with                            a cold snare. Resected and retrieved. Moderate Sedation:      Per Anesthesia Care Recommendation:           - Patient has a contact number available for                            emergencies. The signs and symptoms of potential                            delayed complications were discussed with the                            patient. Return to normal  activities tomorrow.                            Written discharge instructions were provided to the                            patient.                           - Resume previous diet.                           - Continue present medications.                           - Await pathology results.                           - Repeat colonoscopy date to be determined after                            pending pathology results are reviewed for                            surveillance based on pathology results.                           - Return to GI clinic in 6 weeks. Procedure Code(s):        --- Professional ---                           [phone], Colonoscopy, flexible; with removal of                            tumor(s), polyp(s), or other lesion(s) by snare                            technique  51761, 59, Colonoscopy, flexible; with biopsy,                            single or multiple Diagnosis Code(s):        --- Professional ---                           K64.8, Other hemorrhoids                           K52.9, Noninfective gastroenteritis and colitis,                            unspecified                           D12.5, Benign neoplasm of sigmoid colon                           K50.10, Crohn's disease of large intestine without                            complications CPT copyright 2022 American Medical Association. All rights reserved. The codes documented in this report are preliminary and upon coder review may  be revised to meet current compliance requirements. Rolando Cliche. Mordechai April, DO Rolando Cliche. Mordechai April, DO 12/18/2023 11:58:01 AM This report has been signed electronically. Number of Addenda: 0

## 2023-12-19 ENCOUNTER — Encounter (HOSPITAL_COMMUNITY): Payer: Self-pay | Admitting: Internal Medicine

## 2023-12-19 LAB — SURGICAL PATHOLOGY

## 2023-12-23 NOTE — Anesthesia Postprocedure Evaluation (Signed)
 Anesthesia Post Note  Patient: Kevin Wu  Procedure(s) Performed: COLONOSCOPY  Patient location during evaluation: Phase II Anesthesia Type: General Level of consciousness: awake Pain management: pain level controlled Vital Signs Assessment: post-procedure vital signs reviewed and stable Respiratory status: spontaneous breathing and respiratory function stable Cardiovascular status: blood pressure returned to baseline and stable Postop Assessment: no headache and no apparent nausea or vomiting Anesthetic complications: no Comments: Late entry   No notable events documented.   Last Vitals:  Vitals:   12/18/23 0928 12/18/23 1158  BP: (!) 134/90 (!) 127/95  Pulse: 64 69  Resp: 16 11  Temp: 37 C 36.6 C  SpO2: 99% 100%    Last Pain:  Vitals:   12/18/23 1158  TempSrc: Oral  PainSc: 0-No pain                 Coretha Dew

## 2023-12-27 ENCOUNTER — Ambulatory Visit: Payer: Self-pay | Admitting: Internal Medicine

## 2024-01-24 ENCOUNTER — Ambulatory Visit: Admitting: Gastroenterology

## 2024-01-24 ENCOUNTER — Encounter: Payer: Self-pay | Admitting: Gastroenterology

## 2024-01-24 VITALS — BP 129/83 | HR 80 | Temp 98.4°F | Ht 74.0 in | Wt 192.4 lb

## 2024-01-24 DIAGNOSIS — F1090 Alcohol use, unspecified, uncomplicated: Secondary | ICD-10-CM

## 2024-01-24 DIAGNOSIS — K760 Fatty (change of) liver, not elsewhere classified: Secondary | ICD-10-CM

## 2024-01-24 DIAGNOSIS — K5000 Crohn's disease of small intestine without complications: Secondary | ICD-10-CM

## 2024-01-24 DIAGNOSIS — R7401 Elevation of levels of liver transaminase levels: Secondary | ICD-10-CM

## 2024-01-24 DIAGNOSIS — K648 Other hemorrhoids: Secondary | ICD-10-CM

## 2024-01-24 MED ORDER — BUDESONIDE 3 MG PO CPEP: ORAL_CAPSULE | ORAL | 0 refills | Status: AC

## 2024-01-24 NOTE — Patient Instructions (Addendum)
 Let's stop mesalamine , as this is not a good treatment for Crohn's and not helping at this point.  I have sent in budesonide to take. You will take 9 mg for 4 weeks, then 6 mg for 2 weeks, then 3 mg for 2 weeks.   Please have blood work and stool test done so we can see markers of inflammation.  I will see you in 4-6 months! Please call if you notice that you have more frequent stools after finishing budesonide.  It's best to avoid Ibuprofen  and alcohol.   We can recheck liver numbers at next visit!  I enjoyed seeing you again today! I value our relationship and want to provide genuine, compassionate, and quality care. You may receive a survey regarding your visit with me, and I welcome your feedback! Thanks so much for taking the time to complete this. I look forward to seeing you again.      Therisa MICAEL Stager, PhD, ANP-BC [facility]
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
## 2024-05-07 DIAGNOSIS — K5000 Crohn's disease of small intestine without complications: Secondary | ICD-10-CM

## 2024-05-07 MED ORDER — BUDESONIDE 3 MG PO CPEP: ORAL_CAPSULE | ORAL | 0 refills | Status: DC

## 2024-05-07 NOTE — Telephone Encounter (Signed)
 Ladonna: Please arrange follow-up in office with me, non-urgent. I might have him do a virtual/telephone so I can discuss options with him and then see him in clinic once we have started the new IBD medication. He uses MyChart, so you can send him a message that way.   Dena: I have also ordered sed rate, CRP, CBC, CMP, fecal calprotectin, TB gold assay, Hep B surface antigen, lipid profile, and VZV IgG. The labs should already be in the system.

## 2024-05-09 LAB — LIPID PANEL
Chol/HDL Ratio: 1.8 ratio (ref 0.0–5.0)
Cholesterol, Total: 213 mg/dL — ABNORMAL HIGH (ref 100–199)
HDL: 116 mg/dL (ref >39)
LDL Chol Calc (NIH): 84 mg/dL (ref 0–99)
Triglycerides: 78 mg/dL (ref 0–149)
VLDL Cholesterol Cal: 13 mg/dL (ref 5–40)

## 2024-05-09 LAB — COMPREHENSIVE METABOLIC PANEL WITH GFR
ALT: 31 IU/L (ref 0–44)
AST: 45 IU/L — ABNORMAL HIGH (ref 0–40)
Albumin: 4.7 g/dL (ref 4.1–5.1)
Alkaline Phosphatase: 56 IU/L (ref 47–123)
BUN/Creatinine Ratio: 9 (ref 9–20)
BUN: 9 mg/dL (ref 6–24)
Bilirubin Total: 0.4 mg/dL (ref 0.0–1.2)
CO2: 24 mmol/L (ref 20–29)
Calcium: 9.2 mg/dL (ref 8.7–10.2)
Chloride: 104 mmol/L (ref 96–106)
Creatinine, Ser: 0.98 mg/dL (ref 0.76–1.27)
Globulin, Total: 2.6 g/dL (ref 1.5–4.5)
Glucose: 100 mg/dL — ABNORMAL HIGH (ref 70–99)
Potassium: 4.3 mmol/L (ref 3.5–5.2)
Sodium: 144 mmol/L (ref 134–144)
Total Protein: 7.3 g/dL (ref 6.0–8.5)
eGFR: 99 mL/min/1.73 (ref >59)

## 2024-05-09 LAB — CBC WITH DIFFERENTIAL/PLATELET
Basophils Absolute: 0 x10E3/uL (ref 0.0–0.2)
Basos: 1 %
EOS (ABSOLUTE): 0.2 x10E3/uL (ref 0.0–0.4)
Eos: 5 %
Hematocrit: 43 % (ref 37.5–51.0)
Hemoglobin: 14.4 g/dL (ref 13.0–17.7)
Immature Grans (Abs): 0 x10E3/uL (ref 0.0–0.1)
Immature Granulocytes: 0 %
Lymphocytes Absolute: 0.9 x10E3/uL (ref 0.7–3.1)
Lymphs: 20 %
MCH: 32.4 pg (ref 26.6–33.0)
MCHC: 33.5 g/dL (ref 31.5–35.7)
MCV: 97 fL (ref 79–97)
Monocytes Absolute: 0.5 x10E3/uL (ref 0.1–0.9)
Monocytes: 11 %
Neutrophils Absolute: 2.8 x10E3/uL (ref 1.4–7.0)
Neutrophils: 63 %
Platelets: 201 x10E3/uL (ref 150–450)
RBC: 4.45 x10E6/uL (ref 4.14–5.80)
RDW: 12.1 % (ref 11.6–15.4)
WBC: 4.5 x10E3/uL (ref 3.4–10.8)

## 2024-05-09 LAB — VARICELLA ZOSTER ANTIBODY, IGG: Varicella zoster IgG: REACTIVE

## 2024-05-09 LAB — C-REACTIVE PROTEIN: CRP: 3 mg/L (ref 0–10)

## 2024-05-09 LAB — SEDIMENTATION RATE: Sed Rate: 7 mm/h (ref 0–15)

## 2024-05-09 LAB — HEPATITIS B SURFACE ANTIGEN: Hepatitis B Surface Ag: NEGATIVE

## 2024-05-13 ENCOUNTER — Ambulatory Visit: Payer: Self-pay | Admitting: Gastroenterology

## 2024-05-28 LAB — CALPROTECTIN, FECAL: Calprotectin, Fecal: 369 ug/g — ABNORMAL HIGH (ref 0–120)

## 2024-05-29 ENCOUNTER — Telehealth: Payer: Self-pay | Admitting: Gastroenterology

## 2024-05-29 ENCOUNTER — Telehealth: Admitting: Gastroenterology

## 2024-05-29 VITALS — Ht 74.0 in | Wt 190.0 lb

## 2024-05-29 DIAGNOSIS — K50819 Crohn's disease of both small and large intestine with unspecified complications: Secondary | ICD-10-CM

## 2024-05-29 NOTE — Progress Notes (Signed)
 Unable to connect virtually.   No charge for visit.  I discussed on telephone with patient. Separate phone note placed.   Therisa MICAEL Stager, PhD, ANP-BC [facility]

## 2024-05-29 NOTE — Telephone Encounter (Signed)
 Kevin Wu is a 43 y.o. 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 As his last colonoscopy was 2022, we completed this in interim from last appt with findings of inflammation at the IC valve, path with focal moderate chronic active colitis consistent with Crohn's disease. Additional GI history including hepatic steatosis and intermittently elevated transaminases in setting of alcohol.   Much improved with round of entocort.   Needs TB gold assay. He does drink alcohol each evening, typically beer and 2 shots a day. We discussed in depth escalation of therapy. He initially would like to start with Humira. I have reached out again via MyChart to update on Entyvio and Tremfya due to subcutaneous opportunities for this. Awaiting to hear back.   Lab Results  Component Value Date   ALT 31 05/08/2024   AST 45 (H) 05/08/2024   ALKPHOS 56 05/08/2024   BILITOT 0.4 05/08/2024   US  abdomen 2022 with hepatic steatosis, normal spleen.   Lab Results  Component Value Date   WBC 4.5 05/08/2024   HGB 14.4 05/08/2024   HCT 43.0 05/08/2024   MCV 97 05/08/2024   PLT 201 05/08/2024

## 2024-06-17 ENCOUNTER — Telehealth: Payer: Self-pay | Admitting: Gastroenterology

## 2024-06-17 DIAGNOSIS — K50819 Crohn's disease of both small and large intestine with unspecified complications: Secondary | ICD-10-CM

## 2024-06-17 NOTE — Telephone Encounter (Signed)
 Awaiting TB gold assay.  I have also sent mychart message regarding Entyvio and Tremfya as possible options.

## 2024-06-26 ENCOUNTER — Ambulatory Visit: Admitting: Gastroenterology

## 2024-07-07 MED ORDER — BUDESONIDE 3 MG PO CPEP: ORAL_CAPSULE | ORAL | 0 refills | Status: AC

## 2024-07-07 NOTE — Addendum Note (Signed)
 Addended by: SHIRLEAN THERISA ORN on: 07/07/2024 11:02 AM   Modules accepted: Orders

## 2024-07-15 LAB — QUANTIFERON-TB GOLD PLUS
QuantiFERON Mitogen Value: >10 [IU]/mL
QuantiFERON Nil Value: 1.94 [IU]/mL
QuantiFERON TB1 Ag Value: 1.04 [IU]/mL
QuantiFERON TB2 Ag Value: 0.81 [IU]/mL

## 2024-07-16 ENCOUNTER — Ambulatory Visit: Payer: Self-pay | Admitting: Gastroenterology

## 2024-07-29 ENCOUNTER — Ambulatory Visit: Admitting: Gastroenterology

## 2024-07-30 ENCOUNTER — Telehealth: Payer: Self-pay | Admitting: Gastroenterology

## 2024-07-30 MED ORDER — HUMIRA (2 PEN) 40 MG/0.8ML ~~LOC~~ AJKT: 40.0000 mg | AUTO-INJECTOR | SUBCUTANEOUS | 5 refills | Status: DC

## 2024-07-30 MED ORDER — HUMIRA-CD/UC/HS STARTER 80 MG/0.8ML ~~LOC~~ AJKT: AUTO-INJECTOR | SUBCUTANEOUS | 0 refills | Status: AC

## 2024-07-30 NOTE — Telephone Encounter (Signed)
 FYI:  To message below that's fine but any specialty medications has to go to their pharmacy which had already been sent to you

## 2024-07-30 NOTE — Telephone Encounter (Signed)
 The patient came into the office to change his pharmacy.... to [facility] [address] [place], [place].

## 2024-07-30 NOTE — Telephone Encounter (Signed)
 Phoned and advised the pt of his instructions and that I will send to his MyChart as well to have incase he forgets (instead of mailing ). Will fill out documentation for Humira  Complete Program

## 2024-07-30 NOTE — Telephone Encounter (Signed)
 I have sent in Humira  to [facility].  He will need to take 80 mg pen subcutaneous on Day 1, Day 2, and day 15.  On day 29, he will take 40 mg subcutaneously. From there on, he will take it every 2 weeks.   Let's also get him in enrolled in Humira  complete.

## 2024-08-04 ENCOUNTER — Telehealth: Payer: Self-pay

## 2024-08-04 NOTE — Telephone Encounter (Signed)
 Faxed the pt's last office note/ labs to [facility] to get the pt approved for medication. Waiting on confirmation to return regarding the fax

## 2024-08-04 NOTE — Telephone Encounter (Signed)
 Confirmation return ok. Filling out forms for Humira  Complete [facility] Enrollment Form. Therisa Stager, NP and the pt has to sign form then I will fax.   Phoned the pt and asked the pt to come by the office to sign documentation but the pt states he lives too far away and could not do it. I advised the pt that the only way I could get it to him was to mail it to him and he mail it back. Pt agreed that this is the best option for him. So will put in the mail after provider signs.

## 2024-08-05 NOTE — Telephone Encounter (Signed)
 Todd from [employer] [facility] phoned stating the pt's insurance will not cover Humira  but they will cover the generic in 40 mg starter pack. Please advise

## 2024-08-05 NOTE — Telephone Encounter (Signed)
 That's fine if it is the generic form. You can give him a verbal for that. Thanks!

## 2024-08-06 NOTE — Telephone Encounter (Signed)
 Therisa,  I have completed the form, just need your signature and which Rx.

## 2024-08-06 NOTE — Telephone Encounter (Signed)
 Sent Krystal Lighter from [facility] a message advising it is ok to do a generic medication for the pt. Waiting on a response

## 2024-08-06 NOTE — Telephone Encounter (Signed)
 Message sent back from [facility]:  Ok I need an updated RX for whichever the med Kevin Wu wants to go with as well. Amjevita or Adalimumab  adaz. Please resend when time permits so I can work on quarry manager. just remember no 80mg  so has to be the 40mg  x 6 for starter.   I am starting to fill out the new Rx now just advised what you want done next. Please advise

## 2024-08-11 NOTE — Telephone Encounter (Signed)
 Pt's spouse phoned inquiring of the status of the pt's Rx and I advised her of  what was happening and that you were out of the office but you will be back in on Tuesday 20th and we would get back started on this medication.

## 2024-08-12 MED ORDER — ADALIMUMAB-ADAZ 40 MG/0.4ML ~~LOC~~ SOAJ: 1.0000 | SUBCUTANEOUS | 11 refills | Status: DC

## 2024-08-12 MED ORDER — ADALIMUMAB-ADAZ 40 MG/0.4ML ~~LOC~~ SOAJ: 80.0000 mg | Freq: Every day | SUBCUTANEOUS | 0 refills | Status: AC

## 2024-08-12 NOTE — Addendum Note (Signed)
 Addended by: SHIRLEAN THERISA ORN on: 08/12/2024 11:00 AM   Modules accepted: Orders

## 2024-08-12 NOTE — Telephone Encounter (Signed)
 I spoke with Krystal at  [facility]. I have sent in adalimumab -adaz. This is a biosimilar that his insurance should cover.  He will take 2 injections day 1, 2 injections day 2, and 2 injections day 15. Then, starting day 29, he will take 1 injection every 2 weeks. I sent a Mychart message.

## 2024-08-14 ENCOUNTER — Telehealth: Payer: Self-pay

## 2024-08-14 MED ORDER — HUMIRA (2 PEN) 40 MG/0.8ML ~~LOC~~ AJKT: 40.0000 mg | AUTO-INJECTOR | SUBCUTANEOUS | 5 refills | Status: AC

## 2024-08-14 MED ORDER — ADALIMUMAB-ADAZ 40 MG/0.4ML ~~LOC~~ SOAJ: 1.0000 | SUBCUTANEOUS | 11 refills | Status: AC

## 2024-08-14 NOTE — Telephone Encounter (Signed)
 Per Krystal from [facility] below will be scanned to the pt's chart.  Hi Kevin Wu. For [name] [birth year] the Adalimumab  lemmie was approved thru 08/12/2025 but the patient has to fill with [facility] pharmacy so please have anna re-direct RX to them and we will advise patient.

## 2024-08-14 NOTE — Telephone Encounter (Signed)
 Noted. I sent in to [facility] pharmacy. Please let patient know they will fill it. Thanks!

## 2024-08-14 NOTE — Addendum Note (Signed)
 Addended by: SHIRLEAN THERISA ORN on: 08/14/2024 12:17 PM   Modules accepted: Orders

## 2024-08-19 NOTE — Telephone Encounter (Addendum)
 Contacted [facility] pharmacy and spoke with the pharmacist.  I have clarified the adalimumab -adaz biosimilar. He will take 40 mg subcutaneous X 2 on day 1, 40 mg subcutaneous X 2 on day 2, and 40 mg subcutaneous X 2 on day 15. Following this, he will take 40 mg (one pen) every 14 days starting on day 29.   I have sent mychart message to him as well.

## 2024-08-19 NOTE — Telephone Encounter (Signed)
 Putting on your desk.

## 2024-08-22 ENCOUNTER — Telehealth: Payer: Self-pay

## 2024-08-22 NOTE — Telephone Encounter (Signed)
 FYI:  Documentation from [facility]---Provider Notification scanned to the pt's chart under media. No action required on our part.

## 2024-08-27 MED ORDER — ADALIMUMAB-ADAZ 40 MG/0.4ML ~~LOC~~ SOAJ: SUBCUTANEOUS | 0 refills | Status: AC

## 2024-08-27 MED ORDER — ADALIMUMAB-ADAZ 40 MG/0.4ML ~~LOC~~ SOAJ: 40.0000 mg | SUBCUTANEOUS | 11 refills | Status: AC

## 2024-08-27 NOTE — Addendum Note (Signed)
 Addended by: SHIRLEAN THERISA ORN on: 08/27/2024 12:49 PM   Modules accepted: Orders

## 2024-08-27 NOTE — Telephone Encounter (Signed)
 Documentation from [facility] where Provider Action is needed. PA for pt has been denied. Please refer to this information I have downloaded the documentation on my desk to show you tomorrow.

## 2024-08-27 NOTE — Telephone Encounter (Signed)
 Noted  Phoned and advised the pt of his instructions to his medication. Also advised the pt that I will send it in a MyChart message so he can refer back to it so he doesn't forget. Pt expressed understanding

## 2024-08-27 NOTE — Telephone Encounter (Signed)
 I called [facility].   They need a prescription sent in for 28 days at a time, so how it is written is not being processed correctly.  I have RESENT this again. His entire loading dose AND his first maintenance dose will be in the first month supply. After that, he will get 2 pens per month.   He will do 2 pens on Day 1, 2 pens on day 2, and 2 pens on day 15. This will all be in one shipment.  He will then get his next shipment to start on day 29, where he will take 1 pen every 14 days.  A PA may come through for this but should be covered as I have addressed the quantity.

## 2024-09-11 ENCOUNTER — Ambulatory Visit: Admitting: Gastroenterology
